# Patient Record
Sex: Female | Born: 1985 | Race: Black or African American | Hispanic: No | Marital: Married | Smoking: Former smoker
Health system: Southern US, Community
[De-identification: ages and names within clinical notes are randomized; demographics above are authoritative.]

## PROBLEM LIST (undated history)

## (undated) DIAGNOSIS — S40811A Abrasion of right upper arm, initial encounter: Secondary | ICD-10-CM

## (undated) DIAGNOSIS — M67442 Ganglion, left hand: Secondary | ICD-10-CM

## (undated) DIAGNOSIS — G5601 Carpal tunnel syndrome, right upper limb: Secondary | ICD-10-CM

## (undated) DIAGNOSIS — I10 Essential (primary) hypertension: Secondary | ICD-10-CM

## (undated) DIAGNOSIS — M7010 Bursitis, unspecified hand: Secondary | ICD-10-CM

## (undated) HISTORY — DX: Essential (primary) hypertension: I10

---

## 2006-08-17 ENCOUNTER — Emergency Department (HOSPITAL_COMMUNITY): Admission: EM | Admit: 2006-08-17 | Discharge: 2006-08-17 | Payer: Self-pay | Admitting: Emergency Medicine

## 2006-11-16 ENCOUNTER — Inpatient Hospital Stay (HOSPITAL_COMMUNITY): Admission: AD | Admit: 2006-11-16 | Discharge: 2006-11-16 | Payer: Self-pay | Admitting: Gynecology

## 2006-11-16 ENCOUNTER — Ambulatory Visit: Payer: Self-pay | Admitting: *Deleted

## 2006-12-11 ENCOUNTER — Ambulatory Visit (HOSPITAL_COMMUNITY): Admission: RE | Admit: 2006-12-11 | Discharge: 2006-12-11 | Payer: Self-pay | Admitting: Obstetrics and Gynecology

## 2007-01-15 ENCOUNTER — Ambulatory Visit (HOSPITAL_COMMUNITY): Admission: RE | Admit: 2007-01-15 | Discharge: 2007-01-15 | Payer: Self-pay | Admitting: Family Medicine

## 2007-03-01 ENCOUNTER — Inpatient Hospital Stay (HOSPITAL_COMMUNITY): Admission: AD | Admit: 2007-03-01 | Discharge: 2007-03-01 | Payer: Self-pay | Admitting: Obstetrics and Gynecology

## 2007-03-01 ENCOUNTER — Ambulatory Visit: Payer: Self-pay | Admitting: Obstetrics and Gynecology

## 2007-03-25 ENCOUNTER — Ambulatory Visit: Payer: Self-pay | Admitting: Physician Assistant

## 2007-03-25 ENCOUNTER — Inpatient Hospital Stay (HOSPITAL_COMMUNITY): Admission: AD | Admit: 2007-03-25 | Discharge: 2007-03-26 | Payer: Self-pay | Admitting: Obstetrics and Gynecology

## 2007-12-03 ENCOUNTER — Ambulatory Visit: Payer: Self-pay | Admitting: Hematology

## 2007-12-10 LAB — VITAMIN B12: Vitamin B-12: 559 pg/mL (ref 211–911)

## 2007-12-10 LAB — CBC WITH DIFFERENTIAL/PLATELET
BASO%: 0.7 % (ref 0.0–2.0)
Basophils Absolute: 0 10*3/uL (ref 0.0–0.1)
EOS%: 1.8 % (ref 0.0–7.0)
Eosinophils Absolute: 0.1 10*3/uL (ref 0.0–0.5)
HCT: 38.9 % (ref 34.8–46.6)
HGB: 13.5 g/dL (ref 11.6–15.9)
LYMPH%: 40.4 % (ref 14.0–48.0)
MCH: 32.8 pg (ref 26.0–34.0)
MCHC: 34.8 g/dL (ref 32.0–36.0)
MCV: 94.4 fL (ref 81.0–101.0)
MONO#: 0.4 10*3/uL (ref 0.1–0.9)
MONO%: 10 % (ref 0.0–13.0)
NEUT#: 2 10*3/uL (ref 1.5–6.5)
NEUT%: 47.1 % (ref 39.6–76.8)
Platelets: 283 10*3/uL (ref 145–400)
RBC: 4.12 10*6/uL (ref 3.70–5.32)
RDW: 12.6 % (ref 11.3–14.5)
WBC: 4.2 10*3/uL (ref 3.9–10.0)
lymph#: 1.7 10*3/uL (ref 0.9–3.3)

## 2007-12-10 LAB — TSH: TSH: 1.55 u[IU]/mL (ref 0.350–4.500)

## 2007-12-10 LAB — COMPREHENSIVE METABOLIC PANEL
ALT: 14 U/L (ref 0–35)
AST: 16 U/L (ref 0–37)
Albumin: 5 g/dL (ref 3.5–5.2)
Alkaline Phosphatase: 33 U/L — ABNORMAL LOW (ref 39–117)
BUN: 15 mg/dL (ref 6–23)
CO2: 21 mEq/L (ref 19–32)
Calcium: 10 mg/dL (ref 8.4–10.5)
Chloride: 107 mEq/L (ref 96–112)
Creatinine, Ser: 0.81 mg/dL (ref 0.40–1.20)
Glucose, Bld: 94 mg/dL (ref 70–99)
Potassium: 4.4 mEq/L (ref 3.5–5.3)
Sodium: 141 mEq/L (ref 135–145)
Total Bilirubin: 1.3 mg/dL — ABNORMAL HIGH (ref 0.3–1.2)
Total Protein: 7.7 g/dL (ref 6.0–8.3)

## 2007-12-10 LAB — MORPHOLOGY
PLT EST: ADEQUATE
RBC Comments: NORMAL

## 2007-12-10 LAB — LACTATE DEHYDROGENASE: LDH: 175 U/L (ref 94–250)

## 2007-12-10 LAB — FOLATE: Folate: >20 ng/mL

## 2007-12-10 LAB — CHCC SMEAR

## 2007-12-13 LAB — ANA: Anti Nuclear Antibody(ANA): NEGATIVE

## 2007-12-13 LAB — RHEUMATOID FACTOR: Rheumatoid fact SerPl-aCnc: <20 IU/mL (ref 0–20)

## 2008-06-01 ENCOUNTER — Ambulatory Visit: Payer: Self-pay | Admitting: Hematology

## 2008-06-21 ENCOUNTER — Emergency Department (HOSPITAL_COMMUNITY): Admission: EM | Admit: 2008-06-21 | Discharge: 2008-06-21 | Payer: Self-pay | Admitting: Emergency Medicine

## 2008-06-22 LAB — CBC WITH DIFFERENTIAL/PLATELET
BASO%: 0.9 % (ref 0.0–2.0)
Basophils Absolute: 0 10*3/uL (ref 0.0–0.1)
EOS%: 2.9 % (ref 0.0–7.0)
Eosinophils Absolute: 0.1 10*3/uL (ref 0.0–0.5)
HCT: 40.5 % (ref 34.8–46.6)
HGB: 13.9 g/dL (ref 11.6–15.9)
LYMPH%: 25.5 % (ref 14.0–49.7)
MCH: 32.5 pg (ref 25.1–34.0)
MCHC: 34.3 g/dL (ref 31.5–36.0)
MCV: 94.9 fL (ref 79.5–101.0)
MONO#: 0.6 10*3/uL (ref 0.1–0.9)
MONO%: 13.6 % (ref 0.0–14.0)
NEUT#: 2.5 10*3/uL (ref 1.5–6.5)
NEUT%: 57.1 % (ref 38.4–76.8)
Platelets: 271 10*3/uL (ref 145–400)
RBC: 4.26 10*6/uL (ref 3.70–5.45)
RDW: 13.6 % (ref 11.2–14.5)
WBC: 4.4 10*3/uL (ref 3.9–10.3)
lymph#: 1.1 10*3/uL (ref 0.9–3.3)

## 2008-06-22 LAB — COMPREHENSIVE METABOLIC PANEL
ALT: 13 U/L (ref 0–35)
AST: 14 U/L (ref 0–37)
Albumin: 4.7 g/dL (ref 3.5–5.2)
Alkaline Phosphatase: 34 U/L — ABNORMAL LOW (ref 39–117)
BUN: 13 mg/dL (ref 6–23)
CO2: 22 mEq/L (ref 19–32)
Calcium: 9.2 mg/dL (ref 8.4–10.5)
Chloride: 105 mEq/L (ref 96–112)
Creatinine, Ser: 0.68 mg/dL (ref 0.40–1.20)
Glucose, Bld: 91 mg/dL (ref 70–99)
Potassium: 4.3 mEq/L (ref 3.5–5.3)
Sodium: 138 mEq/L (ref 135–145)
Total Bilirubin: 0.9 mg/dL (ref 0.3–1.2)
Total Protein: 7.5 g/dL (ref 6.0–8.3)

## 2008-06-22 LAB — CHCC SMEAR

## 2008-12-06 ENCOUNTER — Emergency Department (HOSPITAL_COMMUNITY): Admission: EM | Admit: 2008-12-06 | Discharge: 2008-12-06 | Payer: Self-pay | Admitting: Family Medicine

## 2009-05-10 ENCOUNTER — Emergency Department (HOSPITAL_COMMUNITY): Admission: EM | Admit: 2009-05-10 | Discharge: 2009-05-10 | Payer: Self-pay | Admitting: Emergency Medicine

## 2009-11-19 ENCOUNTER — Emergency Department (HOSPITAL_COMMUNITY): Admission: EM | Admit: 2009-11-19 | Discharge: 2009-11-19 | Payer: Self-pay | Admitting: Family Medicine

## 2010-04-26 ENCOUNTER — Emergency Department (HOSPITAL_COMMUNITY)
Admission: EM | Admit: 2010-04-26 | Discharge: 2010-04-26 | Payer: Self-pay | Source: Home / Self Care | Admitting: Emergency Medicine

## 2010-04-29 LAB — POCT I-STAT, CHEM 8
BUN: 12 mg/dL (ref 6–23)
Calcium, Ion: 1.15 mmol/L (ref 1.12–1.32)
Chloride: 109 mEq/L (ref 96–112)
Creatinine, Ser: 0.7 mg/dL (ref 0.4–1.2)
Glucose, Bld: 94 mg/dL (ref 70–99)
HCT: 40 % (ref 36.0–46.0)
Hemoglobin: 13.6 g/dL (ref 12.0–15.0)
Potassium: 4.1 mEq/L (ref 3.5–5.1)
Sodium: 141 mEq/L (ref 135–145)
TCO2: 22 mmol/L (ref 0–100)

## 2010-09-04 ENCOUNTER — Inpatient Hospital Stay (INDEPENDENT_AMBULATORY_CARE_PROVIDER_SITE_OTHER)
Admission: RE | Admit: 2010-09-04 | Discharge: 2010-09-04 | Disposition: A | Discharge status: Home / Self Care | Payer: Managed Care, Other (non HMO) | Source: Ambulatory Visit | Attending: Emergency Medicine | Admitting: Emergency Medicine

## 2010-09-04 DIAGNOSIS — J069 Acute upper respiratory infection, unspecified: Secondary | ICD-10-CM

## 2011-01-20 LAB — CBC
HCT: 36.5
Hemoglobin: 12.6
MCHC: 34.6
MCV: 94.2
Platelets: 225
RBC: 3.88
RDW: 13.3
WBC: 6.9

## 2011-01-20 LAB — RPR: RPR Ser Ql: NONREACTIVE

## 2011-01-21 LAB — URINALYSIS, ROUTINE W REFLEX MICROSCOPIC
Bilirubin Urine: NEGATIVE
Glucose, UA: 100 — AB
Hgb urine dipstick: NEGATIVE
Ketones, ur: NEGATIVE
Nitrite: NEGATIVE
Protein, ur: NEGATIVE
Specific Gravity, Urine: 1.025
Urobilinogen, UA: 2 — ABNORMAL HIGH
pH: 6

## 2011-01-21 LAB — WET PREP, GENITAL
Clue Cells Wet Prep HPF POC: NONE SEEN
Trich, Wet Prep: NONE SEEN
Yeast Wet Prep HPF POC: NONE SEEN

## 2011-01-27 LAB — URINALYSIS, ROUTINE W REFLEX MICROSCOPIC
Bilirubin Urine: NEGATIVE
Glucose, UA: NEGATIVE
Hgb urine dipstick: NEGATIVE
Ketones, ur: NEGATIVE
Nitrite: NEGATIVE
Protein, ur: NEGATIVE
Specific Gravity, Urine: 1.02
Urobilinogen, UA: 1
pH: 7

## 2011-01-27 LAB — WET PREP, GENITAL: Yeast Wet Prep HPF POC: NONE SEEN

## 2011-01-27 LAB — GC/CHLAMYDIA PROBE AMP, GENITAL
Chlamydia, DNA Probe: NEGATIVE
GC Probe Amp, Genital: NEGATIVE

## 2011-02-23 ENCOUNTER — Emergency Department (HOSPITAL_COMMUNITY)
Admission: EM | Admit: 2011-02-23 | Discharge: 2011-02-23 | Disposition: A | Discharge status: Home / Self Care | Payer: Managed Care, Other (non HMO) | Attending: Emergency Medicine | Admitting: Emergency Medicine

## 2011-02-23 ENCOUNTER — Encounter: Payer: Self-pay | Admitting: Emergency Medicine

## 2011-02-23 DIAGNOSIS — X58XXXA Exposure to other specified factors, initial encounter: Secondary | ICD-10-CM | POA: Insufficient documentation

## 2011-02-23 DIAGNOSIS — IMO0002 Reserved for concepts with insufficient information to code with codable children: Secondary | ICD-10-CM | POA: Insufficient documentation

## 2011-02-23 DIAGNOSIS — R079 Chest pain, unspecified: Secondary | ICD-10-CM | POA: Insufficient documentation

## 2011-02-23 DIAGNOSIS — S29011A Strain of muscle and tendon of front wall of thorax, initial encounter: Secondary | ICD-10-CM

## 2011-02-23 MED ORDER — PREDNISONE 20 MG PO TABS
60.0000 mg | ORAL_TABLET | Freq: Once | ORAL | Status: AC
  Administered 2011-02-23: 60 mg via ORAL
  Filled 2011-02-23: qty 3

## 2011-02-23 MED ORDER — PREDNISONE 20 MG PO TABS: ORAL_TABLET | ORAL | Status: AC

## 2011-02-23 MED ORDER — HYDROCODONE-ACETAMINOPHEN 5-325 MG PO TABS: 1.0000 | ORAL_TABLET | Freq: Every day | ORAL | Status: AC

## 2011-02-23 NOTE — ED Notes (Signed)
Pt reports chest pain x 1 week. Pt denies any other symptoms.

## 2011-02-23 NOTE — ED Provider Notes (Signed)
History     CSN: [id number] Arrival date & time: 02/23/2011  7:09 PM   First MD Initiated Contact with Patient 02/23/11 2103      Chief Complaint  Patient presents with  . Chest Pain    (Consider location/radiation/quality/duration/timing/severity/associated sxs/prior treatment) HPI Comments: Patient has bilateral upper chest wall tenderness for the past week was treated in January of this year for the same with muscle relaxers and nonsteroidals and taken out of work for 3 months, she returned to the same job, doing the same task and again has pain with exertion  Patient is a 25 y.o. female presenting with chest pain. The history is provided by the patient.  Chest Pain The chest pain began 1 - 2 weeks ago. Chest pain occurs constantly. The chest pain is worsening. The pain is associated with breathing, lifting and exertion. At its most intense, the pain is at 6/10. The pain is currently at 4/10. The severity of the pain is moderate. The quality of the pain is described as aching. The pain does not radiate. Chest pain is worsened by exertion. Pertinent negatives for primary symptoms include no fever, no fatigue, no syncope, no shortness of breath, no cough, no nausea and no vomiting.  Pertinent negatives for associated symptoms include no diaphoresis, no numbness and no weakness. She tried aspirin for the symptoms.     History reviewed. No pertinent past medical history.  History reviewed. No pertinent past surgical history.  No family history on file.  History  Substance Use Topics  . Smoking status: Never Smoker   . Smokeless tobacco: Not on file  . Alcohol Use: Yes    OB History    Grav Para Term Preterm Abortions TAB SAB Ect Mult Living                10 Systems reviewed and are negative for acute change except as noted in the HPI.  Review of Systems  Constitutional: Negative for fever, diaphoresis and fatigue.  Respiratory: Negative for cough and shortness of breath.    Cardiovascular: Positive for chest pain. Negative for syncope.  Gastrointestinal: Negative for nausea and vomiting.  Neurological: Negative for weakness and numbness.    Allergies  Review of patient's allergies indicates no known allergies.  Home Medications   Current Outpatient Rx  Name Route Sig Dispense Refill  . THERA M PLUS PO TABS Oral Take 1 tablet by mouth daily.      Marland Kitchen MEDROXYPROGESTERONE ACETATE 150 MG/ML IM SUSP Intramuscular Inject 150 mg into the muscle every 3 (three) months. Birth control       BP 124/81  Pulse 84  Temp(Src) 98.3 F (36.8 C) (Oral)  Resp 15  SpO2 99%  Physical Exam  Constitutional: She is oriented to person, place, and time. She appears well-developed.  HENT:  Head: Normocephalic.  Eyes: EOM are normal.  Neck: Neck supple.  Cardiovascular: Normal rate.   Pulmonary/Chest: Breath sounds normal.  Abdominal: Bowel sounds are normal.  Musculoskeletal: Normal range of motion.       Right shoulder: She exhibits tenderness. She exhibits no swelling, no effusion and no crepitus.       Arms: Neurological: She is oriented to person, place, and time.  Skin: Skin is warm.  Psychiatric: She has a normal mood and affect.    ED Course  Procedures (including critical care time)  Labs Reviewed - No data to display No results found.   No diagnosis found.    MDM  Chest wall tender Costochondritis        Arman Filter, NP 02/23/11 2305

## 2011-02-23 NOTE — ED Provider Notes (Signed)
Medical screening examination/treatment/procedure(s) were performed by non-physician practitioner and as supervising physician I was immediately available for consultation/collaboration.   Geoffery Lyons, MD 02/23/11 [phone]

## 2011-03-20 ENCOUNTER — Encounter (HOSPITAL_COMMUNITY): Payer: Self-pay | Admitting: Emergency Medicine

## 2011-03-20 ENCOUNTER — Emergency Department (HOSPITAL_COMMUNITY)
Admission: EM | Admit: 2011-03-20 | Discharge: 2011-03-20 | Disposition: A | Discharge status: Home / Self Care | Payer: Managed Care, Other (non HMO) | Attending: Emergency Medicine | Admitting: Emergency Medicine

## 2011-03-20 ENCOUNTER — Emergency Department (HOSPITAL_COMMUNITY): Payer: Managed Care, Other (non HMO)

## 2011-03-20 ENCOUNTER — Other Ambulatory Visit: Payer: Self-pay

## 2011-03-20 ENCOUNTER — Emergency Department (INDEPENDENT_AMBULATORY_CARE_PROVIDER_SITE_OTHER)
Admission: EM | Admit: 2011-03-20 | Discharge: 2011-03-20 | Disposition: A | Discharge status: Home / Self Care | Payer: Managed Care, Other (non HMO) | Source: Home / Self Care | Attending: Emergency Medicine | Admitting: Emergency Medicine

## 2011-03-20 DIAGNOSIS — R0789 Other chest pain: Secondary | ICD-10-CM

## 2011-03-20 DIAGNOSIS — R0781 Pleurodynia: Secondary | ICD-10-CM

## 2011-03-20 DIAGNOSIS — R071 Chest pain on breathing: Secondary | ICD-10-CM | POA: Insufficient documentation

## 2011-03-20 LAB — BASIC METABOLIC PANEL
BUN: 11 mg/dL (ref 6–23)
Calcium: 9.1 mg/dL (ref 8.4–10.5)
Creatinine, Ser: 0.61 mg/dL (ref 0.50–1.10)
GFR calc Af Amer: >90 mL/min (ref >90)
GFR calc non Af Amer: >90 mL/min (ref >90)
Glucose, Bld: 92 mg/dL (ref 70–99)
Potassium: 3.4 mEq/L — ABNORMAL LOW (ref 3.5–5.1)

## 2011-03-20 LAB — CBC
HCT: 37.6 % (ref 36.0–46.0)
Hemoglobin: 12.7 g/dL (ref 12.0–15.0)
MCH: 31.6 pg (ref 26.0–34.0)
MCHC: 33.8 g/dL (ref 30.0–36.0)
MCV: 93.5 fL (ref 78.0–100.0)
Platelets: 271 K/uL (ref 150–400)
RBC: 4.02 MIL/uL (ref 3.87–5.11)
RDW: 12.8 % (ref 11.5–15.5)
WBC: 4.4 K/uL (ref 4.0–10.5)

## 2011-03-20 LAB — D-DIMER, QUANTITATIVE: D-Dimer, Quant: 0.53 ug{FEU}/mL — ABNORMAL HIGH (ref 0.00–0.48)

## 2011-03-20 MED ORDER — NAPROXEN 500 MG PO TABS: 500.0000 mg | ORAL_TABLET | Freq: Two times a day (BID) | ORAL | Status: DC

## 2011-03-20 MED ORDER — IBUPROFEN 800 MG PO TABS
ORAL_TABLET | ORAL | Status: AC
  Filled 2011-03-20: qty 1

## 2011-03-20 MED ORDER — HYDROCODONE-ACETAMINOPHEN 5-325 MG PO TABS
1.0000 | ORAL_TABLET | Freq: Once | ORAL | Status: AC
  Administered 2011-03-20: 1 via ORAL
  Filled 2011-03-20: qty 1

## 2011-03-20 MED ORDER — METAXALONE 800 MG PO TABS: 800.0000 mg | ORAL_TABLET | Freq: Three times a day (TID) | ORAL | Status: AC

## 2011-03-20 MED ORDER — IOHEXOL 350 MG/ML SOLN
100.0000 mL | Freq: Once | INTRAVENOUS | Status: AC | PRN
  Administered 2011-03-20: 100 mL via INTRAVENOUS

## 2011-03-20 MED ORDER — IBUPROFEN 800 MG PO TABS
800.0000 mg | ORAL_TABLET | Freq: Once | ORAL | Status: AC
  Administered 2011-03-20: 800 mg via ORAL

## 2011-03-20 MED ORDER — GI COCKTAIL ~~LOC~~
30.0000 mL | Freq: Once | ORAL | Status: AC
  Administered 2011-03-20: 30 mL via ORAL
  Filled 2011-03-20: qty 30

## 2011-03-20 MED ORDER — HYDROCODONE-ACETAMINOPHEN 5-325 MG PO TABS: 2.0000 | ORAL_TABLET | ORAL | Status: AC | PRN

## 2011-03-20 NOTE — ED Notes (Signed)
Pt st's she was at work and had sharp pain in left chest.   Pt st's pain increases with deep breath

## 2011-03-20 NOTE — ED Notes (Signed)
Old and new EKG given to EDP

## 2011-03-20 NOTE — ED Notes (Signed)
Pt states a sudden sharp pain in the left side of her chest and under her arm. Pt states that the pain is a achy pain right now. Pt denies SOB or N/V. Pt alert and oriented and able to follow commands and move extremities.

## 2011-03-20 NOTE — ED Provider Notes (Signed)
History     CSN: [id number] Arrival date & time: 03/20/2011  7:19 PM   First MD Initiated Contact with Patient 03/20/11 1834      Chief Complaint  Patient presents with  . Chest Pain    reports chest pain , sharp pain.  seen in ed for pain, transported by ems.  patient evaluated for pulmonary embolus--all tests negative.  continues to have left torso pain, under left breast and under left arm.  no improvement in pain.      HPI Comments: Pt with intermittent sharp nonradiating left sided CP lasting minutes and resolving. Worse with deep inspiration, lifting arms, torso rotation. States she does a lot of overhead lifting, pushing/pulling at work. Similar episodes since January. Has been treated successfully with NSAIDS, muscle relaxants, norco in past.  Seems to be worse at end of week after doing a lot of lifting/pushing/pulling. No cough, wheeze, SOB, N/V, fevers, abd pain, palpitations, diaphoresis, fevers. No recent/remote h/o trauma to chest. No rash. Seen in [facility] ED last night. Dimer slightly elevated but ED workup neg for coronary cause of CP. CT angio chest neg for PE.   Patient is a 25 y.o. female presenting with chest pain. The history is provided by the patient.  Chest Pain Chest pain occurs intermittently. The pain is associated with breathing. The quality of the pain is described as sharp and similar to previous episodes. The pain does not radiate. Chest pain is worsened by certain positions and deep breathing. Pertinent negatives for primary symptoms include no fever, no fatigue, no syncope, no shortness of breath, no cough, no wheezing, no palpitations, no abdominal pain, no nausea and no vomiting.  Pertinent negatives for associated symptoms include no diaphoresis and no near-syncope.  Pertinent negatives for past medical history include no CAD, no DVT and no PE.  Pertinent negatives for family medical history include: no early MI in family.     History reviewed. No pertinent past  medical history.  History reviewed. No pertinent past surgical history.  Family History  Problem Relation Age of Onset  . Lupus Mother   . Stroke Other   . Rheum arthritis Other     History  Substance Use Topics  . Smoking status: Never Smoker   . Smokeless tobacco: Not on file  . Alcohol Use: Yes    OB History    Grav Para Term Preterm Abortions TAB SAB Ect Mult Living                  Review of Systems  Constitutional: Negative for fever, diaphoresis and fatigue.  HENT: Negative.   Respiratory: Negative for cough, shortness of breath and wheezing.   Cardiovascular: Positive for chest pain. Negative for palpitations, leg swelling, syncope and near-syncope.  Gastrointestinal: Negative for nausea, vomiting and abdominal pain.  Genitourinary: Negative.   Musculoskeletal: Negative for back pain.  Skin: Negative for rash.  Neurological: Negative.     Allergies  Review of patient's allergies indicates no known allergies.  Home Medications   Current Outpatient Rx  Name Route Sig Dispense Refill  . HYDROCODONE-ACETAMINOPHEN 5-325 MG PO TABS Oral Take 2 tablets by mouth every 4 (four) hours as needed for pain. 20 tablet 0  . MEDROXYPROGESTERONE ACETATE 150 MG/ML IM SUSP Intramuscular Inject 150 mg into the muscle every 3 (three) months. Birth control     . METAXALONE 800 MG PO TABS Oral Take 1 tablet (800 mg total) by mouth 3 (three) times daily. 21 tablet 0  .  THERA M PLUS PO TABS Oral Take 1 tablet by mouth daily.      Marland Kitchen NAPROXEN 500 MG PO TABS Oral Take 1 tablet (500 mg total) by mouth 2 (two) times daily. 20 tablet 0    BP 127/86  Pulse 66  Temp(Src) 98.6 F (37 C) (Oral)  Resp 18  SpO2 100%  Physical Exam  Nursing note and vitals reviewed. Constitutional: She is oriented to person, place, and time. She appears well-developed and well-nourished. No distress.  HENT:  Head: Normocephalic and atraumatic.  Eyes: EOM are normal. Pupils are equal, round, and  reactive to light.  Neck: Normal range of motion.  Cardiovascular: Regular rhythm.   Pulmonary/Chest: Effort normal and breath sounds normal. She has no wheezes. She has no rales. She exhibits tenderness.       Pain worse with lateral bending to right. No rash  Abdominal: She exhibits no distension. There is Tenderness: left sided chest wall tenderness..  Musculoskeletal: Normal range of motion.  Neurological: She is alert and oriented to person, place, and time.  Skin: Skin is warm and dry. No rash noted.  Psychiatric: She has a normal mood and affect. Her behavior is normal. Judgment and thought content normal.    ED Course  Procedures (including critical care time)  Labs Reviewed - No data to display Dg Chest 2 View  03/20/2011  *RADIOLOGY REPORT*  Clinical Data: Left-sided chest pain.  CHEST - 2 VIEW  Comparison: Chest radiograph performed 04/26/2010  Findings: The lungs are well-aerated and clear.  There is no evidence of focal opacification, pleural effusion or pneumothorax.  The heart is normal in size; the mediastinal contour is within normal limits.  No acute osseous abnormalities are seen.  IMPRESSION: No acute cardiopulmonary process seen.  Original Report Authenticated By: Tonia Ghent, M.D.   Ct Angio Chest W/cm &/or Wo Cm  03/20/2011  *RADIOLOGY REPORT*  Clinical Data:  Sudden sharp left-sided chest pain, extending under left arm.  CT ANGIOGRAPHY CHEST WITH CONTRAST  Technique:  Multidetector CT imaging of the chest was performed using the standard protocol during bolus administration of intravenous contrast.  Multiplanar CT image reconstructions including MIPs were obtained to evaluate the vascular anatomy.  Contrast: OMNIPAQUE IOHEXOL 350 MG/ML IV SOLN  Comparison:  Chest radiograph performed earlier today at 02:59 a.m.  Findings:  There is no evidence of pulmonary embolus.  The lungs are clear bilaterally.  There is no evidence of significant focal consolidation, pleural  effusion or pneumothorax. No masses are identified; no abnormal focal contrast enhancement is seen.  The mediastinum is unremarkable in appearance.  No mediastinal lymphadenopathy is seen.  No pericardial effusion is identified. The great vessels are unremarkable in appearance.  Residual thymic tissue is within normal limits.  No axillary lymphadenopathy is seen.  The thyroid gland is unremarkable in appearance.  The visualized portions of the liver and spleen are unremarkable. The visualized portions of the pancreas, gallbladder, adrenal glands and kidneys are within normal limits.  Note is made of reflux of contrast into the IVC and hepatic veins.  A tiny hiatal hernia is seen.  No acute osseous abnormalities are seen.  Review of the MIP images confirms the above findings.  IMPRESSION:  1.  No evidence of pulmonary embolus. 2.  Lungs clear bilaterally. 3.  Tiny hiatal hernia noted.  Original Report Authenticated By: Tonia Ghent, M.D.     1. Musculoskeletal chest pain     Date: 03/20/2011  Rate: 57  Rhythm: normal sinus rhythm  QRS Axis: right  Intervals: normal  ST/T Wave abnormalities: normal  Conduction Disutrbances:none  Narrative Interpretation:   Old EKG Reviewed: unchanged    MDM  Previous chart, labs, imaging reviewed.  Pt with extensive negative ED workup last night. Pain unchanged here. No EKG changes. Was not sent home with rx from ED. Is currently seeing Occupational health for this as it is a WC comp. Will tx with high dose nsaids, muscle relaxants and have f/u with them.   Luiz Blare, MD 03/20/11 2257

## 2011-03-20 NOTE — ED Notes (Signed)
See above

## 2011-03-20 NOTE — ED Notes (Signed)
Pt placed backed on cardiac monitor, bp cuff, and pulse ox after CT scan

## 2011-03-20 NOTE — ED Provider Notes (Signed)
History     CSN: [id number] Arrival date & time: 03/20/2011 12:58 AM   First MD Initiated Contact with Patient 03/20/11 0205      Chief Complaint  Patient presents with  . Chest Pain   HPI  History provided by the patient. Pt is a 25 year old female who complains of left sharp chest pain that came on acutely around 11:30 PM (3 hours ago) while walking at work.  Pt reports pain is worse with deep breathing.  Pt denies any heart palpitations, SOB, hemoptysis, N/V, sweats.  Pain does not radiate. Pain is improved at rest.  Pt uses Depo-provera for birth control.  Pt denise any recent travel, surgery, hx of cancer, previous DVT or PE.  Pt has no other significant PMH.   History reviewed. No pertinent past medical history.  History reviewed. No pertinent past surgical history.  History reviewed. No pertinent family history.  History  Substance Use Topics  . Smoking status: Never Smoker   . Smokeless tobacco: Not on file  . Alcohol Use: Yes    OB History    Grav Para Term Preterm Abortions TAB SAB Ect Mult Living                  Review of Systems  Constitutional: Negative for fever and chills.  Respiratory: Negative for cough and shortness of breath.   Cardiovascular: Positive for chest pain. Negative for palpitations.  Gastrointestinal: Negative for nausea, abdominal pain, diarrhea and constipation.  Genitourinary: Negative for dysuria, hematuria and flank pain.  Neurological: Negative for dizziness, syncope and light-headedness.  All other systems reviewed and are negative.    Allergies  Review of patient's allergies indicates no known allergies.  Home Medications   Current Outpatient Rx  Name Route Sig Dispense Refill  . ASPIRIN 81 MG PO CHEW Oral Chew 81 mg by mouth once.      Marland Kitchen MEDROXYPROGESTERONE ACETATE 150 MG/ML IM SUSP Intramuscular Inject 150 mg into the muscle every 3 (three) months. Birth control     . THERA M PLUS PO TABS Oral Take 1 tablet by mouth  daily.        BP 114/76  Pulse 66  Temp(Src) 98.6 F (37 C) (Oral)  Resp 16  SpO2 99%  Physical Exam  Nursing note and vitals reviewed. Constitutional: She is oriented to person, place, and time. She appears well-developed and well-nourished. No distress.  HENT:  Head: Normocephalic.  Eyes: EOM are normal. Pupils are equal, round, and reactive to light.  Neck: Normal range of motion. Neck supple.  Cardiovascular: Normal rate, regular rhythm and normal heart sounds.   Pulmonary/Chest: Effort normal and breath sounds normal. She has no wheezes. She has no rales.       Reproducible left lateral chest wall tenderness underneath the axilla. No deformity or crepitus  Abdominal: Soft. Bowel sounds are normal. She exhibits no distension. There is no tenderness. There is no rebound.  Neurological: She is alert and oriented to person, place, and time.  Skin: Skin is warm.  Psychiatric: She has a normal mood and affect. Her behavior is normal.    ED Course  Procedures (including critical care time)  Labs Reviewed  D-DIMER, QUANTITATIVE - Abnormal; Notable for the following:    D-Dimer, Quant 0.53 (*)    All other components within normal limits  CBC  POCT I-STAT TROPONIN I  BASIC METABOLIC PANEL  POCT CARDIAC MARKERS  I-STAT TROPONIN I   Results for orders placed during the  hospital encounter of 03/20/11  CBC      Component Value Range   WBC 4.4  4.0 - 10.5 (K/uL)   RBC 4.02  3.87 - 5.11 (MIL/uL)   Hemoglobin 12.7  12.0 - 15.0 (g/dL)   HCT 19.1  47.8 - 29.5 (%)   MCV 93.5  78.0 - 100.0 (fL)   MCH 31.6  26.0 - 34.0 (pg)   MCHC 33.8  30.0 - 36.0 (g/dL)   RDW 62.1  30.8 - 65.7 (%)   Platelets 271  150 - 400 (K/uL)  BASIC METABOLIC PANEL      Component Value Range   Sodium 139  135 - 145 (mEq/L)   Potassium 3.4 (*) 3.5 - 5.1 (mEq/L)   Chloride 106  96 - 112 (mEq/L)   CO2 22  19 - 32 (mEq/L)   Glucose, Bld 92  70 - 99 (mg/dL)   BUN 11  6 - 23 (mg/dL)   Creatinine, Ser 8.46   0.50 - 1.10 (mg/dL)   Calcium 9.1  8.4 - 96.2 (mg/dL)   GFR calc non Af Amer >90  >90 (mL/min)   GFR calc Af Amer >90  >90 (mL/min)  POCT I-STAT TROPONIN I      Component Value Range   Troponin i, poc 0.00  0.00 - 0.08 (ng/mL)   Comment 3           D-DIMER, QUANTITATIVE      Component Value Range   D-Dimer, Quant 0.53 (*) 0.00 - 0.48 (ug/mL-FEU)      Dg Chest 2 View  03/20/2011  *RADIOLOGY REPORT*  Clinical Data: Left-sided chest pain.  CHEST - 2 VIEW  Comparison: Chest radiograph performed 04/26/2010  Findings: The lungs are well-aerated and clear.  There is no evidence of focal opacification, pleural effusion or pneumothorax.  The heart is normal in size; the mediastinal contour is within normal limits.  No acute osseous abnormalities are seen.  IMPRESSION: No acute cardiopulmonary process seen.  Original Report Authenticated By: Tonia Ghent, M.D.   Ct Angio Chest W/cm &/or Wo Cm  03/20/2011  *RADIOLOGY REPORT*  Clinical Data:  Sudden sharp left-sided chest pain, extending under left arm.  CT ANGIOGRAPHY CHEST WITH CONTRAST  Technique:  Multidetector CT imaging of the chest was performed using the standard protocol during bolus administration of intravenous contrast.  Multiplanar CT image reconstructions including MIPs were obtained to evaluate the vascular anatomy.  Contrast: OMNIPAQUE IOHEXOL 350 MG/ML IV SOLN  Comparison:  Chest radiograph performed earlier today at 02:59 a.m.  Findings:  There is no evidence of pulmonary embolus.  The lungs are clear bilaterally.  There is no evidence of significant focal consolidation, pleural effusion or pneumothorax. No masses are identified; no abnormal focal contrast enhancement is seen.  The mediastinum is unremarkable in appearance.  No mediastinal lymphadenopathy is seen.  No pericardial effusion is identified. The great vessels are unremarkable in appearance.  Residual thymic tissue is within normal limits.  No axillary lymphadenopathy is  seen.  The thyroid gland is unremarkable in appearance.  The visualized portions of the liver and spleen are unremarkable. The visualized portions of the pancreas, gallbladder, adrenal glands and kidneys are within normal limits.  Note is made of reflux of contrast into the IVC and hepatic veins.  A tiny hiatal hernia is seen.  No acute osseous abnormalities are seen.  Review of the MIP images confirms the above findings.  IMPRESSION:  1.  No evidence of pulmonary embolus. 2.  Lungs clear bilaterally. 3.  Tiny hiatal hernia noted.  Original Report Authenticated By: Tonia Ghent, M.D.     1. Pleuritic chest pain   2. Musculoskeletal chest pain       MDM  2:00A.m. patient seen and evaluated. Patient in no acute distress. Patient with normal respirations and good O2 sats. Patient with no tachycardia.     Date: 03/20/2011  Rate: 70  Rhythm: normal sinus rhythm  QRS Axis: right  Intervals: normal  ST/T Wave abnormalities: normal  Conduction Disutrbances:none  Narrative Interpretation:   Old EKG Reviewed: unchanged from 04/26/1010      Angus Seller, PA 03/20/11 [phone]

## 2011-03-21 NOTE — ED Provider Notes (Signed)
Medical screening examination/treatment/procedure(s) were performed by non-physician practitioner and as supervising physician I was immediately available for consultation/collaboration.  Geoffery Lyons, MD 03/21/11 [phone]

## 2011-12-14 DIAGNOSIS — G5601 Carpal tunnel syndrome, right upper limb: Secondary | ICD-10-CM

## 2011-12-14 HISTORY — DX: Carpal tunnel syndrome, right upper limb: G56.01

## 2011-12-17 ENCOUNTER — Encounter (HOSPITAL_COMMUNITY): Payer: Self-pay | Admitting: Emergency Medicine

## 2011-12-17 ENCOUNTER — Emergency Department (INDEPENDENT_AMBULATORY_CARE_PROVIDER_SITE_OTHER)
Admission: EM | Admit: 2011-12-17 | Discharge: 2011-12-17 | Disposition: A | Discharge status: Home / Self Care | Payer: Managed Care, Other (non HMO) | Source: Home / Self Care | Attending: Family Medicine | Admitting: Family Medicine

## 2011-12-17 DIAGNOSIS — G5600 Carpal tunnel syndrome, unspecified upper limb: Secondary | ICD-10-CM

## 2011-12-17 DIAGNOSIS — G5603 Carpal tunnel syndrome, bilateral upper limbs: Secondary | ICD-10-CM

## 2011-12-17 MED ORDER — DICLOFENAC SODIUM 1 % TD GEL: 4.0000 g | Freq: Four times a day (QID) | TRANSDERMAL | Status: DC

## 2011-12-17 NOTE — ED Notes (Signed)
Pt here because she has exacerbated her carpal tunnel at work last night. She said it is always harder on the 1st and even worse because there was a holiday. Pt states her hands were thick and numb feeling this morning after working, and now she can't really grasp or clutch things. Pt has carpal tunnel surgery scheduled for Monday, but states her surgeon could not give her a prescription for pain meds.

## 2011-12-17 NOTE — ED Provider Notes (Signed)
History     CSN: [id number]  Arrival date & time 12/17/11  7829   First MD Initiated Contact with Patient 12/17/11 1813      Chief Complaint  Patient presents with  . Carpal Tunnel    (Consider location/radiation/quality/duration/timing/severity/associated sxs/prior treatment) Patient is a 26 y.o. female presenting with wrist pain. The history is provided by the patient.  Wrist Pain This is a chronic problem. The current episode started more than 1 week ago. The problem has been gradually worsening. Associated symptoms comments: Numbness and tingling , pos ncs done by dr Merlyn Lot today. Is scheduled for surgery on mon.Marland Kitchen    History reviewed. No pertinent past medical history.  History reviewed. No pertinent past surgical history.  Family History  Problem Relation Age of Onset  . Lupus Mother   . Stroke Other   . Rheum arthritis Other     History  Substance Use Topics  . Smoking status: Never Smoker   . Smokeless tobacco: Not on file  . Alcohol Use: Yes    OB History    Grav Para Term Preterm Abortions TAB SAB Ect Mult Living                  Review of Systems  Constitutional: Negative.   Musculoskeletal: Positive for joint swelling.    Allergies  Review of patient's allergies indicates no known allergies.  Home Medications   Current Outpatient Rx  Name Route Sig Dispense Refill  . DICLOFENAC SODIUM 1 % TD GEL Topical Apply 4 g topically 4 (four) times daily. Please instruct in dosing. 100 g 1  . MEDROXYPROGESTERONE ACETATE 150 MG/ML IM SUSP Intramuscular Inject 150 mg into the muscle every 3 (three) months. Birth control     . THERA M PLUS PO TABS Oral Take 1 tablet by mouth daily.      Marland Kitchen NAPROXEN 500 MG PO TABS Oral Take 1 tablet (500 mg total) by mouth 2 (two) times daily. 20 tablet 0    BP 130/84  Pulse 90  Temp 98.4 F (36.9 C) (Oral)  Resp 16  SpO2 98%  Physical Exam  Nursing note and vitals reviewed. Constitutional: She is oriented to person,  place, and time. She appears well-developed and well-nourished.  Musculoskeletal: She exhibits tenderness.       Right wrist: She exhibits decreased range of motion, tenderness and swelling.       Arms: Neurological: She is alert and oriented to person, place, and time.  Skin: Skin is warm and dry.    ED Course  Procedures (including critical care time)  Labs Reviewed - No data to display No results found.   1. Carpal tunnel syndrome, bilateral       MDM          Linna Hoff, MD 12/17/11 [phone]

## 2011-12-19 ENCOUNTER — Encounter (HOSPITAL_BASED_OUTPATIENT_CLINIC_OR_DEPARTMENT_OTHER): Payer: Self-pay | Admitting: *Deleted

## 2011-12-19 ENCOUNTER — Other Ambulatory Visit: Payer: Self-pay | Admitting: Orthopedic Surgery

## 2011-12-19 DIAGNOSIS — S40811A Abrasion of right upper arm, initial encounter: Secondary | ICD-10-CM

## 2011-12-19 HISTORY — DX: Abrasion of right upper arm, initial encounter: S40.811A

## 2011-12-22 ENCOUNTER — Ambulatory Visit (HOSPITAL_BASED_OUTPATIENT_CLINIC_OR_DEPARTMENT_OTHER)
Admission: RE | Admit: 2011-12-22 | Discharge: 2011-12-22 | Disposition: A | Discharge status: Home / Self Care | Payer: Managed Care, Other (non HMO) | Source: Ambulatory Visit | Attending: Orthopedic Surgery | Admitting: Orthopedic Surgery

## 2011-12-22 ENCOUNTER — Encounter (HOSPITAL_BASED_OUTPATIENT_CLINIC_OR_DEPARTMENT_OTHER)
Admission: RE | Disposition: A | Discharge status: Home / Self Care | Payer: Self-pay | Source: Ambulatory Visit | Attending: Orthopedic Surgery

## 2011-12-22 ENCOUNTER — Ambulatory Visit (HOSPITAL_BASED_OUTPATIENT_CLINIC_OR_DEPARTMENT_OTHER): Payer: Managed Care, Other (non HMO) | Admitting: *Deleted

## 2011-12-22 ENCOUNTER — Encounter (HOSPITAL_BASED_OUTPATIENT_CLINIC_OR_DEPARTMENT_OTHER): Payer: Self-pay | Admitting: *Deleted

## 2011-12-22 DIAGNOSIS — G5600 Carpal tunnel syndrome, unspecified upper limb: Secondary | ICD-10-CM | POA: Insufficient documentation

## 2011-12-22 HISTORY — DX: Bursitis, unspecified hand: M70.10

## 2011-12-22 HISTORY — DX: Carpal tunnel syndrome, right upper limb: G56.01

## 2011-12-22 HISTORY — DX: Abrasion of right upper arm, initial encounter: S40.811A

## 2011-12-22 HISTORY — PX: CARPAL TUNNEL RELEASE: SHX101

## 2011-12-22 SURGERY — CARPAL TUNNEL RELEASE
Anesthesia: General | Site: Wrist | Laterality: Right | Wound class: Clean

## 2011-12-22 MED ORDER — BUPIVACAINE HCL (PF) 0.25 % IJ SOLN
INTRAMUSCULAR | Status: DC | PRN
  Administered 2011-12-22: 5 mL

## 2011-12-22 MED ORDER — OXYCODONE HCL 5 MG/5ML PO SOLN: 5.0000 mg | Freq: Once | ORAL | Status: DC | PRN

## 2011-12-22 MED ORDER — ONDANSETRON HCL 4 MG/2ML IJ SOLN
INTRAMUSCULAR | Status: DC | PRN
  Administered 2011-12-22: 4 mg via INTRAVENOUS

## 2011-12-22 MED ORDER — DEXAMETHASONE SODIUM PHOSPHATE 10 MG/ML IJ SOLN
INTRAMUSCULAR | Status: DC | PRN
  Administered 2011-12-22: 10 mg via INTRAVENOUS

## 2011-12-22 MED ORDER — LIDOCAINE HCL (CARDIAC) 20 MG/ML IV SOLN
INTRAVENOUS | Status: DC | PRN
  Administered 2011-12-22: 80 mg via INTRAVENOUS

## 2011-12-22 MED ORDER — METOCLOPRAMIDE HCL 5 MG/ML IJ SOLN
INTRAMUSCULAR | Status: DC | PRN
  Administered 2011-12-22: 10 mg via INTRAVENOUS

## 2011-12-22 MED ORDER — FENTANYL CITRATE 0.05 MG/ML IJ SOLN
25.0000 ug | INTRAMUSCULAR | Status: DC | PRN
  Administered 2011-12-22: 50 ug via INTRAVENOUS
  Administered 2011-12-22 (×2): 25 ug via INTRAVENOUS

## 2011-12-22 MED ORDER — OXYCODONE HCL 5 MG PO TABS: 5.0000 mg | ORAL_TABLET | Freq: Once | ORAL | Status: DC | PRN

## 2011-12-22 MED ORDER — LACTATED RINGERS IV SOLN
INTRAVENOUS | Status: DC
  Administered 2011-12-22: 08:00:00 via INTRAVENOUS

## 2011-12-22 MED ORDER — PROPOFOL 10 MG/ML IV BOLUS
INTRAVENOUS | Status: DC | PRN
  Administered 2011-12-22: 250 mg via INTRAVENOUS

## 2011-12-22 MED ORDER — CEFAZOLIN SODIUM-DEXTROSE 2-3 GM-% IV SOLR
INTRAVENOUS | Status: DC | PRN
  Administered 2011-12-22: 2 g via INTRAVENOUS

## 2011-12-22 MED ORDER — FENTANYL CITRATE 0.05 MG/ML IJ SOLN
INTRAMUSCULAR | Status: DC | PRN
  Administered 2011-12-22: 50 ug via INTRAVENOUS
  Administered 2011-12-22 (×2): 25 ug via INTRAVENOUS

## 2011-12-22 MED ORDER — CEFAZOLIN SODIUM 1-5 GM-% IV SOLN: 1.0000 g | Freq: Once | INTRAVENOUS | Status: DC

## 2011-12-22 MED ORDER — METOCLOPRAMIDE HCL 5 MG/ML IJ SOLN: 10.0000 mg | Freq: Once | INTRAMUSCULAR | Status: DC | PRN

## 2011-12-22 MED ORDER — HYDROCODONE-ACETAMINOPHEN 5-325 MG PO TABS: ORAL_TABLET | ORAL | Status: DC

## 2011-12-22 SURGICAL SUPPLY — 37 items
BANDAGE ELASTIC 3 VELCRO ST LF (GAUZE/BANDAGES/DRESSINGS) ×2 IMPLANT
BANDAGE GAUZE ELAST BULKY 4 IN (GAUZE/BANDAGES/DRESSINGS) ×2 IMPLANT
BLADE MINI RND TIP GREEN BEAV (BLADE) IMPLANT
BLADE SURG 15 STRL LF DISP TIS (BLADE) ×2 IMPLANT
BLADE SURG 15 STRL SS (BLADE) ×2
BNDG ESMARK 4X9 LF (GAUZE/BANDAGES/DRESSINGS) ×2 IMPLANT
CHLORAPREP W/TINT 26ML (MISCELLANEOUS) ×2 IMPLANT
CLOTH BEACON ORANGE TIMEOUT ST (SAFETY) ×2 IMPLANT
CORDS BIPOLAR (ELECTRODE) ×2 IMPLANT
COVER MAYO STAND STRL (DRAPES) ×2 IMPLANT
COVER TABLE BACK 60X90 (DRAPES) ×2 IMPLANT
CUFF TOURNIQUET SINGLE 18IN (TOURNIQUET CUFF) ×2 IMPLANT
DRAPE EXTREMITY T 121X128X90 (DRAPE) ×2 IMPLANT
DRAPE SURG 17X23 STRL (DRAPES) ×2 IMPLANT
DRSG PAD ABDOMINAL 8X10 ST (GAUZE/BANDAGES/DRESSINGS) ×2 IMPLANT
GAUZE XEROFORM 1X8 LF (GAUZE/BANDAGES/DRESSINGS) ×2 IMPLANT
GLOVE BIO SURGEON STRL SZ 6.5 (GLOVE) ×2 IMPLANT
GLOVE BIO SURGEON STRL SZ7.5 (GLOVE) ×2 IMPLANT
GLOVE EXAM NITRILE PF MED BLUE (GLOVE) ×2 IMPLANT
GOWN PREVENTION PLUS XLARGE (GOWN DISPOSABLE) ×2 IMPLANT
GOWN STRL REIN XL XLG (GOWN DISPOSABLE) ×2 IMPLANT
NDL SAFETY ECLIPSE 18X1.5 (NEEDLE) ×1 IMPLANT
NEEDLE 27GAX1X1/2 (NEEDLE) ×2 IMPLANT
NEEDLE HYPO 18GX1.5 SHARP (NEEDLE) ×1
NEEDLE HYPO 25X1 1.5 SAFETY (NEEDLE) IMPLANT
NS IRRIG 1000ML POUR BTL (IV SOLUTION) ×2 IMPLANT
PACK BASIN DAY SURGERY FS (CUSTOM PROCEDURE TRAY) ×2 IMPLANT
PADDING CAST ABS 4INX4YD NS (CAST SUPPLIES)
PADDING CAST ABS COTTON 4X4 ST (CAST SUPPLIES) IMPLANT
SPONGE GAUZE 4X4 12PLY (GAUZE/BANDAGES/DRESSINGS) ×2 IMPLANT
STOCKINETTE 4X48 STRL (DRAPES) ×2 IMPLANT
SUT ETHILON 4 0 PS 2 18 (SUTURE) ×2 IMPLANT
SYR BULB 3OZ (MISCELLANEOUS) ×2 IMPLANT
SYR CONTROL 10ML LL (SYRINGE) ×2 IMPLANT
TOWEL OR 17X24 6PK STRL BLUE (TOWEL DISPOSABLE) ×2 IMPLANT
UNDERPAD 30X30 INCONTINENT (UNDERPADS AND DIAPERS) ×2 IMPLANT
WATER STERILE IRR 1000ML POUR (IV SOLUTION) IMPLANT

## 2011-12-22 NOTE — Op Note (Signed)
Dictation [phone]

## 2011-12-22 NOTE — H&P (Signed)
  Brandy Tyler is an 26 y.o. female.   Chief Complaint: carpal tunnel HPI: 26 yo rhd female with 8 months of pain at the right wrist and pins and needles sensation in the digits.  Previous carpal tunnel syndrome when pregnant that resolved.  Positive nocturnal symptoms.  Positive nerve conduction studies.  Past Medical History  Diagnosis Date  . Bursitis of hand     right hand - also tendonitis  . 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 Abrasion of arm, right 12/19/2011    Past Surgical History  Procedure Date  . No past surgeries     Family History  Problem Relation Age of Onset  . Lupus Mother   . Stroke Other   . Rheum arthritis Other    Social History:  reports that she has never smoked. She does not have any smokeless tobacco history on file. She reports that she drinks alcohol. She reports that she does not use illicit drugs.  Allergies: No Known Allergies  Medications Prior to Admission  Medication Sig Dispense Refill  . medroxyPROGESTERone (DEPO-PROVERA) 150 MG/ML injection Inject 150 mg into the muscle every 3 (three) months. Birth control      . Multiple Vitamins-Minerals (MULTIVITAMINS THER. W/MINERALS) TABS Take 1 tablet by mouth daily.          Results for orders placed during the hospital encounter of 12/22/11 (from the past 48 hour(s))  POCT HEMOGLOBIN-HEMACUE     Status: Normal   Collection Time   12/22/11  8:01 AM      Component Value Range Comment   Hemoglobin 12.3  12.0 - 15.0 g/dL     No results found.   A comprehensive review of systems was negative except for: Eyes: positive for contacts/glasses  Blood pressure 118/82, pulse 72, temperature 99 F (37.2 C), temperature source Oral, resp. rate 20, height 4\' 10"  (1.473 m), weight 54.432 kg (120 lb), SpO2 100.00%.  General appearance: alert, cooperative and appears stated age Head: Normocephalic, without obvious abnormality, atraumatic Neck: supple, symmetrical, trachea midline Resp: clear  to auscultation bilaterally Cardio: regular rate and rhythm GI: soft, non-tender; bowel sounds normal; no masses,  no organomegaly Extremities: light touch sensation and capillary refill present all digits.  +epl/fpl/io.  positive tinels, phalens, durkins at median nerve at carpal tunnel on right side. Pulses: 2+ and symmetric Skin: Skin color, texture, turgor normal. No rashes or lesions Neurologic: Grossly normal Incision/Wound: na  Assessment/Plan Right carpal tunnel syndrome.  Discussed non operative and operative treatment options.  She wishes to proceed with surgical release.  Risks, benefits, and alternatives of surgery were discussed and the patient agrees with the plan of care.   Brandy Tyler 12/22/2011, 8:46 AM

## 2011-12-22 NOTE — Anesthesia Preprocedure Evaluation (Addendum)
Anesthesia Evaluation  Patient identified by MRN, date of birth, ID band Patient awake    Reviewed: Allergy & Precautions, H&P , NPO status , Patient's Chart, lab work & pertinent test results, reviewed documented beta blocker date and time   Airway Mallampati: II TM Distance: >3 FB Neck ROM: full    Dental   Pulmonary neg pulmonary ROS,  breath sounds clear to auscultation        Cardiovascular negative cardio ROS  Rhythm:regular     Neuro/Psych negative neurological ROS  negative psych ROS   GI/Hepatic negative GI ROS, Neg liver ROS,   Endo/Other  negative endocrine ROS  Renal/GU negative Renal ROS  negative genitourinary   Musculoskeletal   Abdominal   Peds  Hematology negative hematology ROS (+)   Anesthesia Other Findings See surgeon's H&P   Reproductive/Obstetrics negative OB ROS                           Anesthesia Physical Anesthesia Plan  ASA: I  Anesthesia Plan: General   Post-op Pain Management:    Induction: Intravenous  Airway Management Planned: LMA  Additional Equipment:   Intra-op Plan:   Post-operative Plan: Extubation in OR  Informed Consent: I have reviewed the patients History and Physical, chart, labs and discussed the procedure including the risks, benefits and alternatives for the proposed anesthesia with the patient or authorized representative who has indicated his/her understanding and acceptance.   Dental Advisory Given  Plan Discussed with: CRNA and Surgeon  Anesthesia Plan Comments:         Anesthesia Quick Evaluation  

## 2011-12-22 NOTE — Anesthesia Procedure Notes (Signed)
Procedure Name: LMA Insertion Date/Time: 12/22/2011 8:57 AM Performed by: Meyer Russel Pre-anesthesia Checklist: Patient identified, Emergency Drugs available, Suction available and Patient being monitored Patient Re-evaluated:Patient Re-evaluated prior to inductionOxygen Delivery Method: Circle System Utilized Preoxygenation: Pre-oxygenation with 100% oxygen Intubation Type: IV induction Ventilation: Mask ventilation without difficulty LMA: LMA inserted LMA Size: 3.0 Number of attempts: 1 Airway Equipment and Method: bite block Placement Confirmation: positive ETCO2 and breath sounds checked- equal and bilateral Tube secured with: Tape Dental Injury: Teeth and Oropharynx as per pre-operative assessment

## 2011-12-22 NOTE — Transfer of Care (Signed)
Immediate Anesthesia Transfer of Care Note  Patient: Brandy Tyler  Procedure(s) Performed: Procedure(s) (LRB) with comments: CARPAL TUNNEL RELEASE (Right)  Patient Location: PACU  Anesthesia Type: General  Level of Consciousness: sedated  Airway & Oxygen Therapy: Patient Spontanous Breathing and Patient connected to face mask oxygen  Post-op Assessment: Report given to PACU RN and Post -op Vital signs reviewed and stable  Post vital signs: Reviewed and stable  Complications: No apparent anesthesia complications

## 2011-12-22 NOTE — Anesthesia Postprocedure Evaluation (Signed)
Anesthesia Post Note  Patient: Brandy Tyler  Procedure(s) Performed: Procedure(s) (LRB): CARPAL TUNNEL RELEASE (Right)  Anesthesia type: General  Patient location: PACU  Post pain: Pain level controlled  Post assessment: Patient's Cardiovascular Status Stable  Last Vitals:  Filed Vitals:   12/22/11 1015  BP: 107/75  Pulse: 63  Temp:   Resp: 20    Post vital signs: Reviewed and stable  Level of consciousness: alert  Complications: No apparent anesthesia complications

## 2011-12-23 ENCOUNTER — Encounter (HOSPITAL_BASED_OUTPATIENT_CLINIC_OR_DEPARTMENT_OTHER): Payer: Self-pay | Admitting: Orthopedic Surgery

## 2011-12-23 NOTE — Op Note (Signed)
[name], Tyler NO.:  [id number]  MEDICAL RECORD NO.:  [id number]  LOCATION:                                 FACILITY:  PHYSICIAN:  Betha Loa, MD        DATE OF BIRTH:  [birth year]  DATE OF PROCEDURE:  12/22/2011 DATE OF DISCHARGE:                              OPERATIVE REPORT   PREOPERATIVE DIAGNOSIS:  Right carpal tunnel syndrome.  POSTOPERATIVE DIAGNOSIS:  Right carpal tunnel syndrome.  PROCEDURE:  Right carpal tunnel release.  SURGEON:  Betha Loa, MD  ASSISTANT:  None.  ANESTHESIA:  General.  IV FLUIDS:  Per anesthesia flow sheet.  ESTIMATED BLOOD LOSS:  Minimal.  COMPLICATIONS:  None.  SPECIMENS:  None.  TOURNIQUET TIME:  15 minutes.  DISPOSITION:  Stable to PACU.  INDICATIONS:  Brandy Tyler is a 26 year old right-hand-dominant female who has had pain in her right wrist and pins and needle sensation in the fingers for approximately 7-8 months.  She was seen in the office where she was found to have positive Tinel's and Phalen's jerk into the median nerve at the carpal tunnel.  Nerve conduction studies were positive. She has nocturnal symptoms.  She wished to have right carpal tunnel release to relieve the symptoms.  Risks, benefits, and alternatives of surgery were discussed including the risk of blood loss, infection, damage to nerves, vessels, tendons, ligaments, bone, failure of surgery, need for additional surgery, complications with wound healing, continued pain, continued carpal tunnel syndrome.  She voiced understanding of these risks and elected to proceed.  OPERATIVE COURSE:  After being identified preoperatively by myself, the patient and I agreed upon procedure and site of procedure.  The surgical site was marked.  The risks, benefits, and alternatives of surgery were reviewed and she wished to proceed.  Surgical consent had been signed. She was given 1 g of IV Ancef as preoperative antibiotic prophylaxis. She was  transported to the operating room, placed on the operating table in a supine position with the right upper extremity on arm board. General anesthesia was induced by anesthesiologist.  The right upper extremity was prepped and draped in normal sterile orthopedic fashion. Surgical pause was performed between surgeons, anesthesia, and operating staff, and all were in agreement as to the patient, procedure, and site of procedure.  Tourniquet at the proximal aspect of the forearm was inflated to 250 mmHg after exsanguination of the limb with an Esmarch bandage.  Incision was made longitudinally over the transverse carpal ligament.  This was carried into subcutaneous tissues by spreading technique.  Bipolar electrocautery was used to obtain hemostasis.  The transverse carpal ligament was identified and was incised sharply at its distal direction.  Care was taken to ensure complete decompression distally.  It was then incised proximally.  The scissors were used to split the distal aspect of the volar antebrachial fascia.  A finger was placed into the wound to ensure complete decompression, which was the case.  The nerve was inspected.  It was adherent to the undersurface of the radial leaflet of the transverse carpal ligament.  The motor branch was identified and was intact.  The wound was copiously  irrigated with sterile saline.  It was closed with 4-0 nylon in a horizontal mattress fashion.  It was injected with 10 mL of 0.25% plain Marcaine to aid in postoperative analgesia.  It was then dressed with sterile Xeroform, 4x4s, and ABD and wrapped with Kerlix and Ace bandage.  Tourniquet was deflated at 15 minutes.  The fingertips were pink with brisk capillary refill after deflation of tourniquet.  Operative drapes were broken down.  The patient was awoken from anesthesia safely.  She was transferred back to the stretcher and taken to PACU in stable condition. I will see her back in the office in  1 week for postoperative followup. I will give her Norco 5/325 1-2 p.o. q.6 h. p.r.n. pain, dispensed #30.     Betha Loa, MD     KK/MEDQ  D:  12/22/2011  T:  12/23/2011  Job:  [id number]

## 2012-02-03 ENCOUNTER — Other Ambulatory Visit: Payer: Self-pay | Admitting: Orthopedic Surgery

## 2012-02-04 ENCOUNTER — Encounter (HOSPITAL_BASED_OUTPATIENT_CLINIC_OR_DEPARTMENT_OTHER): Payer: Self-pay | Admitting: *Deleted

## 2012-02-12 ENCOUNTER — Ambulatory Visit (HOSPITAL_BASED_OUTPATIENT_CLINIC_OR_DEPARTMENT_OTHER): Payer: Managed Care, Other (non HMO) | Admitting: Anesthesiology

## 2012-02-12 ENCOUNTER — Encounter (HOSPITAL_BASED_OUTPATIENT_CLINIC_OR_DEPARTMENT_OTHER): Payer: Self-pay | Admitting: Anesthesiology

## 2012-02-12 ENCOUNTER — Encounter (HOSPITAL_BASED_OUTPATIENT_CLINIC_OR_DEPARTMENT_OTHER): Payer: Self-pay

## 2012-02-12 ENCOUNTER — Encounter (HOSPITAL_BASED_OUTPATIENT_CLINIC_OR_DEPARTMENT_OTHER)
Admission: RE | Disposition: A | Discharge status: Home / Self Care | Payer: Self-pay | Source: Ambulatory Visit | Attending: Orthopedic Surgery

## 2012-02-12 ENCOUNTER — Ambulatory Visit (HOSPITAL_BASED_OUTPATIENT_CLINIC_OR_DEPARTMENT_OTHER)
Admission: RE | Admit: 2012-02-12 | Discharge: 2012-02-12 | Disposition: A | Discharge status: Home / Self Care | Payer: Managed Care, Other (non HMO) | Source: Ambulatory Visit | Attending: Orthopedic Surgery | Admitting: Orthopedic Surgery

## 2012-02-12 DIAGNOSIS — M6740 Ganglion, unspecified site: Secondary | ICD-10-CM | POA: Insufficient documentation

## 2012-02-12 HISTORY — PX: MASS EXCISION: SHX2000

## 2012-02-12 HISTORY — DX: Ganglion, left hand: M67.442

## 2012-02-12 LAB — POCT HEMOGLOBIN-HEMACUE: Hemoglobin: 14.6 g/dL (ref 12.0–15.0)

## 2012-02-12 SURGERY — EXCISION MASS
Anesthesia: General | Site: Hand | Laterality: Left | Wound class: Clean

## 2012-02-12 MED ORDER — CHLORHEXIDINE GLUCONATE 4 % EX LIQD: 60.0000 mL | Freq: Once | CUTANEOUS | Status: DC

## 2012-02-12 MED ORDER — BUPIVACAINE HCL (PF) 0.25 % IJ SOLN
INTRAMUSCULAR | Status: DC | PRN
  Administered 2012-02-12: 5 mL

## 2012-02-12 MED ORDER — FENTANYL CITRATE 0.05 MG/ML IJ SOLN
INTRAMUSCULAR | Status: DC | PRN
  Administered 2012-02-12: 100 ug via INTRAVENOUS

## 2012-02-12 MED ORDER — OXYCODONE HCL 5 MG PO TABS: 5.0000 mg | ORAL_TABLET | Freq: Once | ORAL | Status: DC | PRN

## 2012-02-12 MED ORDER — HYDROCODONE-ACETAMINOPHEN 5-325 MG PO TABS: ORAL_TABLET | ORAL | Status: DC

## 2012-02-12 MED ORDER — HYDROMORPHONE HCL PF 1 MG/ML IJ SOLN: 0.2500 mg | INTRAMUSCULAR | Status: DC | PRN

## 2012-02-12 MED ORDER — MEPERIDINE HCL 25 MG/ML IJ SOLN: 6.2500 mg | INTRAMUSCULAR | Status: DC | PRN

## 2012-02-12 MED ORDER — MIDAZOLAM HCL 5 MG/5ML IJ SOLN
INTRAMUSCULAR | Status: DC | PRN
  Administered 2012-02-12: 2 mg via INTRAVENOUS

## 2012-02-12 MED ORDER — ONDANSETRON HCL 4 MG/2ML IJ SOLN
INTRAMUSCULAR | Status: DC | PRN
  Administered 2012-02-12: 4 mg via INTRAVENOUS

## 2012-02-12 MED ORDER — OXYCODONE HCL 5 MG/5ML PO SOLN: 5.0000 mg | Freq: Once | ORAL | Status: DC | PRN

## 2012-02-12 MED ORDER — LACTATED RINGERS IV SOLN
INTRAVENOUS | Status: DC
  Administered 2012-02-12: 08:00:00 via INTRAVENOUS

## 2012-02-12 MED ORDER — CEFAZOLIN SODIUM-DEXTROSE 2-3 GM-% IV SOLR
2.0000 g | Freq: Once | INTRAVENOUS | Status: AC
  Administered 2012-02-12: 2 g via INTRAVENOUS

## 2012-02-12 MED ORDER — DEXAMETHASONE SODIUM PHOSPHATE 10 MG/ML IJ SOLN
INTRAMUSCULAR | Status: DC | PRN
  Administered 2012-02-12: 10 mg via INTRAVENOUS

## 2012-02-12 MED ORDER — ONDANSETRON HCL 4 MG/2ML IJ SOLN: 4.0000 mg | Freq: Once | INTRAMUSCULAR | Status: DC | PRN

## 2012-02-12 SURGICAL SUPPLY — 52 items
BANDAGE COBAN STERILE 2 (GAUZE/BANDAGES/DRESSINGS) ×2 IMPLANT
BANDAGE CONFORM 2  STR LF (GAUZE/BANDAGES/DRESSINGS) IMPLANT
BANDAGE CONFORM 3  STR LF (GAUZE/BANDAGES/DRESSINGS) ×2 IMPLANT
BANDAGE ELASTIC 3 VELCRO ST LF (GAUZE/BANDAGES/DRESSINGS) IMPLANT
BANDAGE GAUZE ELAST BULKY 4 IN (GAUZE/BANDAGES/DRESSINGS) IMPLANT
BANDAGE GAUZE STRT 1 STR LF (GAUZE/BANDAGES/DRESSINGS) IMPLANT
BENZOIN TINCTURE PRP APPL 2/3 (GAUZE/BANDAGES/DRESSINGS) IMPLANT
BLADE MINI RND TIP GREEN BEAV (BLADE) IMPLANT
BLADE SURG 15 STRL LF DISP TIS (BLADE) ×2 IMPLANT
BLADE SURG 15 STRL SS (BLADE) ×2
BNDG COHESIVE 1X5 TAN STRL LF (GAUZE/BANDAGES/DRESSINGS) IMPLANT
BNDG ELASTIC 2 VLCR STRL LF (GAUZE/BANDAGES/DRESSINGS) IMPLANT
BNDG ESMARK 4X9 LF (GAUZE/BANDAGES/DRESSINGS) IMPLANT
BNDG PLASTER X FAST 3X3 WHT LF (CAST SUPPLIES) IMPLANT
CHLORAPREP W/TINT 26ML (MISCELLANEOUS) ×2 IMPLANT
CLOTH BEACON ORANGE TIMEOUT ST (SAFETY) ×2 IMPLANT
CORDS BIPOLAR (ELECTRODE) ×2 IMPLANT
COVER MAYO STAND STRL (DRAPES) ×2 IMPLANT
COVER TABLE BACK 60X90 (DRAPES) ×2 IMPLANT
CUFF TOURNIQUET SINGLE 18IN (TOURNIQUET CUFF) ×2 IMPLANT
DRAPE EXTREMITY T 121X128X90 (DRAPE) ×2 IMPLANT
DRAPE SURG 17X23 STRL (DRAPES) ×2 IMPLANT
GAUZE SPONGE 4X4 12PLY STRL LF (GAUZE/BANDAGES/DRESSINGS) ×2 IMPLANT
GAUZE XEROFORM 1X8 LF (GAUZE/BANDAGES/DRESSINGS) ×2 IMPLANT
GLOVE BIO SURGEON STRL SZ7.5 (GLOVE) ×2 IMPLANT
GLOVE BIOGEL M STRL SZ7.5 (GLOVE) ×2 IMPLANT
GLOVE BIOGEL PI IND STRL 8 (GLOVE) ×2 IMPLANT
GLOVE BIOGEL PI INDICATOR 8 (GLOVE) ×2
GOWN PREVENTION PLUS XLARGE (GOWN DISPOSABLE) IMPLANT
GOWN PREVENTION PLUS XXLARGE (GOWN DISPOSABLE) ×2 IMPLANT
GOWN STRL REIN XL XLG (GOWN DISPOSABLE) ×2 IMPLANT
NEEDLE HYPO 25X1 1.5 SAFETY (NEEDLE) ×2 IMPLANT
NS IRRIG 1000ML POUR BTL (IV SOLUTION) ×2 IMPLANT
PACK BASIN DAY SURGERY FS (CUSTOM PROCEDURE TRAY) ×2 IMPLANT
PAD CAST 3X4 CTTN HI CHSV (CAST SUPPLIES) IMPLANT
PAD CAST 4YDX4 CTTN HI CHSV (CAST SUPPLIES) IMPLANT
PADDING CAST ABS 4INX4YD NS (CAST SUPPLIES) ×1
PADDING CAST ABS COTTON 4X4 ST (CAST SUPPLIES) ×1 IMPLANT
PADDING CAST COTTON 3X4 STRL (CAST SUPPLIES)
PADDING CAST COTTON 4X4 STRL (CAST SUPPLIES)
SPONGE GAUZE 4X4 12PLY (GAUZE/BANDAGES/DRESSINGS) ×2 IMPLANT
STOCKINETTE 4X48 STRL (DRAPES) ×2 IMPLANT
STRIP CLOSURE SKIN 1/2X4 (GAUZE/BANDAGES/DRESSINGS) ×2 IMPLANT
SUT ETHILON 3 0 PS 1 (SUTURE) IMPLANT
SUT ETHILON 4 0 PS 2 18 (SUTURE) ×2 IMPLANT
SUT MNCRL AB 4-0 PS2 18 (SUTURE) ×2 IMPLANT
SUT VIC AB 4-0 P2 18 (SUTURE) ×2 IMPLANT
SYR BULB 3OZ (MISCELLANEOUS) ×2 IMPLANT
SYR CONTROL 10ML LL (SYRINGE) ×2 IMPLANT
TOWEL OR 17X24 6PK STRL BLUE (TOWEL DISPOSABLE) ×2 IMPLANT
UNDERPAD 30X30 INCONTINENT (UNDERPADS AND DIAPERS) ×2 IMPLANT
WATER STERILE IRR 1000ML POUR (IV SOLUTION) IMPLANT

## 2012-02-12 NOTE — H&P (Signed)
  Brandy Tyler is an 26 y.o. female.   Chief Complaint: left hand mass HPI: 26 yo female with left hand mass on dorsum.  Bothersome to her.  She would like it removed.  Past Medical History  Diagnosis Date  . Bursitis of hand     right hand - also tendonitis  . 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 Abrasion of arm, right 12/19/2011  . Ganglion of left hand     Past Surgical History  Procedure Date  . Carpal tunnel release 12/22/2011    Procedure: CARPAL TUNNEL RELEASE;  Surgeon: Tami Ribas, MD;  Location: [facility];  Service: Orthopedics;  Laterality: Right;    Family History  Problem Relation Age of Onset  . Lupus Mother   . Stroke Other   . Rheum arthritis Other    Social History:  reports that she has never smoked. She does not have any smokeless tobacco history on file. She reports that she drinks alcohol. She reports that she does not use illicit drugs.  Allergies: No Known Allergies  Medications Prior to Admission  Medication Sig Dispense Refill  . Multiple Vitamins-Minerals (MULTIVITAMINS THER. W/MINERALS) TABS Take 1 tablet by mouth daily.        . medroxyPROGESTERone (DEPO-PROVERA) 150 MG/ML injection Inject 150 mg into the muscle every 3 (three) months. Birth control        No results found for this or any previous visit (from the past 48 hour(s)).  No results found.   A comprehensive review of systems was negative except for: Eyes: positive for contacts/glasses  Blood pressure 117/77, pulse 90, temperature 98.3 F (36.8 C), temperature source Oral, resp. rate 20, height 4\' 10"  (1.473 m), weight 54.885 kg (121 lb), SpO2 100.00%.  General appearance: alert, cooperative and appears stated age Head: Normocephalic, without obvious abnormality, atraumatic Neck: supple, symmetrical, trachea midline Resp: clear to auscultation bilaterally Cardio: regular rate and rhythm GI: non tender Extremities: intact sensation and capillary refill  all digits.  +epl/fpl/io.  mass on dorsum of left hand.  transilluminates.  no erythema. Pulses: 2+ and symmetric Skin: Skin color, texture, turgor normal. No rashes or lesions Neurologic: Grossly normal Incision/Wound: na  Assessment/Plan Left hand dorsal ganglion cyst.  Non operative and operative treatment options were discussed with the patient and patient wishes to proceed with operative treatment. Risks, benefits, and alternatives of surgery were discussed and the patient agrees with the plan of care.   Odessa Morren R 02/12/2012, 8:37 AM

## 2012-02-12 NOTE — Anesthesia Preprocedure Evaluation (Signed)

## 2012-02-12 NOTE — Anesthesia Postprocedure Evaluation (Signed)
Anesthesia Post Note  Patient: Brandy Tyler  Procedure(s) Performed: Procedure(s) (LRB): EXCISION MASS (Left)  Anesthesia type: general  Patient location: PACU  Post pain: Pain level controlled  Post assessment: Patient's Cardiovascular Status Stable  Last Vitals:  Filed Vitals:   02/12/12 0931  BP: 117/94  Pulse: 88  Temp: 36.5 C  Resp: 14    Post vital signs: Reviewed and stable  Level of consciousness: sedated  Complications: No apparent anesthesia complications

## 2012-02-12 NOTE — Anesthesia Procedure Notes (Signed)
Procedure Name: LMA Insertion Date/Time: 02/12/2012 8:55 AM Performed by: Burna Cash Pre-anesthesia Checklist: Patient identified, Emergency Drugs available, Suction available and Patient being monitored Patient Re-evaluated:Patient Re-evaluated prior to inductionOxygen Delivery Method: Circle System Utilized Preoxygenation: Pre-oxygenation with 100% oxygen Intubation Type: IV induction Ventilation: Mask ventilation without difficulty LMA: LMA inserted LMA Size: 4.0 Number of attempts: 1 Airway Equipment and Method: bite block Placement Confirmation: positive ETCO2 Tube secured with: Tape Dental Injury: Teeth and Oropharynx as per pre-operative assessment

## 2012-02-12 NOTE — Transfer of Care (Signed)
Immediate Anesthesia Transfer of Care Note  Patient: Brandy Tyler  Procedure(s) Performed: Procedure(s) (LRB) with comments: EXCISION MASS (Left) - LEFT HAND EXCISION MASS  Patient Location: PACU  Anesthesia Type:General  Level of Consciousness: sedated  Airway & Oxygen Therapy: Patient Spontanous Breathing and Patient connected to face mask oxygen  Post-op Assessment: Report given to PACU RN and Post -op Vital signs reviewed and stable  Post vital signs: Reviewed and stable  Complications: No apparent anesthesia complications

## 2012-02-12 NOTE — Op Note (Signed)
Dictation [phone]

## 2012-02-13 ENCOUNTER — Encounter (HOSPITAL_BASED_OUTPATIENT_CLINIC_OR_DEPARTMENT_OTHER): Payer: Self-pay | Admitting: Orthopedic Surgery

## 2012-02-13 NOTE — Op Note (Signed)
[name], [name] NO.:  [id number]  MEDICAL RECORD NO.:  [id number]  LOCATION:                                 FACILITY:  PHYSICIAN:  Betha Loa, MD        DATE OF BIRTH:  1985/10/21  DATE OF PROCEDURE:  02/12/2012 DATE OF DISCHARGE:                              OPERATIVE REPORT   PREOPERATIVE DIAGNOSIS:  Left hand dorsal mass.  POSTOPERATIVE DIAGNOSIS:  Left hand intratendinous ganglion.  PROCEDURE:  Excision of mass, left hand.  SURGEON:  Betha Loa, MD  ASSISTANT:  None.  ANESTHESIA:  General.  IV FLUIDS:  Per anesthesia flow sheet.  ESTIMATED BLOOD LOSS:  Minimal.  COMPLICATIONS:  None.  SPECIMENS:  Left hand mass to Pathology.  DISPOSITION:  Stable to PACU.  INDICATIONS:  Brandy Tyler is a 26 year old female who has noted a mass on the dorsum of her left hand.  This has been bothersome to her.  She would like it excised.  Risks, benefits, and alternatives of excision were discussed including risk of blood loss, infection, damage to nerves, vessels, tendons, ligaments, bone; failure of surgery; need for additional surgery, complications with wound healing; continued pain; recurrence of mass.  She voiced understanding of these risks and elected to proceed.  OPERATIVE COURSE:  After being identified preoperatively by myself, the patient and I agreed upon the procedure and site of procedure.  Surgical site was marked.  The risks, benefits, and alternatives of surgery were reviewed and she wished to proceed.  Surgical consent had been signed. She was given 2 g of IV Ancef as a preoperative antibiotic prophylaxis. She was transported to the operating room, placed on the operating room table in a supine position with left upper extremity on arm board. General anesthesia was induced by the anesthesiologist.  Left upper extremity was prepped and draped in normal sterile orthopedic fashion. Surgical pause was performed between surgeons,  anesthesia, and operating staff, and all were in agreement as to the patient, procedure, and site of procedure.  Tourniquet at the proximal aspect of the extremity was inflated to 250 mmHg after exsanguination of the limb with an Esmarch bandage.  A longitudinal incision was made over the mass and carried into subcutaneous tissues by spreading technique.  All neurovascular structures were protected.  The mass was easily identified.  It appeared to be a ganglion within the tendon substance of the EDC to the ring finger.  It was freed of its soft tissue attachments.  There was clear gelatinous fluid within it.  It was excised in its entirety.  It coursed approximately a short distance within the tendon.  This was removed. The mass was sent to Pathology for examination.  The wound was copiously irrigated with sterile saline.  It was closed with 4-0 Vicryl suture in the subcutaneous tissues in an inverted interrupted fashion and the skin was closed with 4-0 Monocryl in a running subcuticular fashion.  This was augmented with Steri-Strips.  The wound was injected with 5 mL of 0.25% plain Marcaine to aid in postoperative analgesia.  It was then dressed with sterile Xeroform, 4x4s, and wrapped with a Kling and Coban  dressing lightly.  Tourniquet was deflated at 23 minutes.  The fingertips were pink with brisk capillary refill after deflation of the tourniquet.  Operative drapes were broken down and the patient was awoken from anesthesia safely.  She was transferred back to the stretcher and taken to PACU in stable condition.  I will see her back in the office in 1 week for postoperative followup.  I will give her Norco 5/325, 1-2 p.o. q.6 hours p.r.n. pain, dispensed #30.     Betha Loa, MD     KK/MEDQ  D:  02/12/2012  T:  02/13/2012  Job:  [id number]

## 2012-09-22 IMAGING — CT CT ANGIO CHEST
2 of 8 series · 19 of 46 positions shown · IV contrast (APPLIED)
Comparison: Chest radiograph performed earlier today at [DATE] a.m.

CLINICAL DATA: Sudden sharp left-sided chest pain, extending under
left arm.

CT ANGIOGRAPHY CHEST WITH CONTRAST
TECHNIQUE: Multidetector CT imaging of the chest was performed
using the standard protocol during bolus administration of
intravenous contrast.  Multiplanar CT image reconstructions
including MIPs were obtained to evaluate the vascular anatomy.
Contrast: 100mL OMNIPAQUE IOHEXOL 350 MG/ML IV SOLN

[Series 7: pulm embolism 0.75 b25f thin · axial · 0.53mm/px · z∈[-299,-27]mm · 16 of 1001 slices shown]
[im 48/1001  lung]
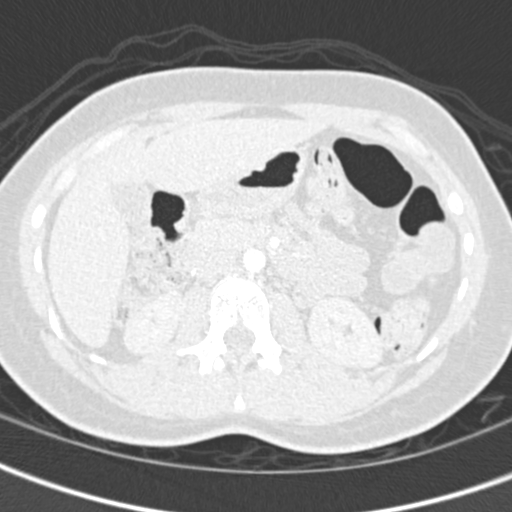
[im 96/1001  soft-tissue]
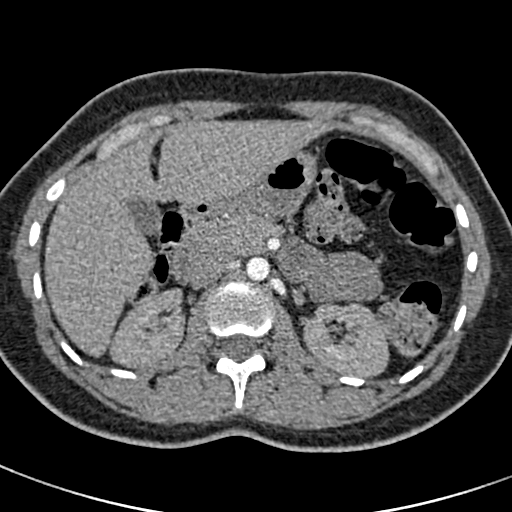
[im 191/1001  lung]
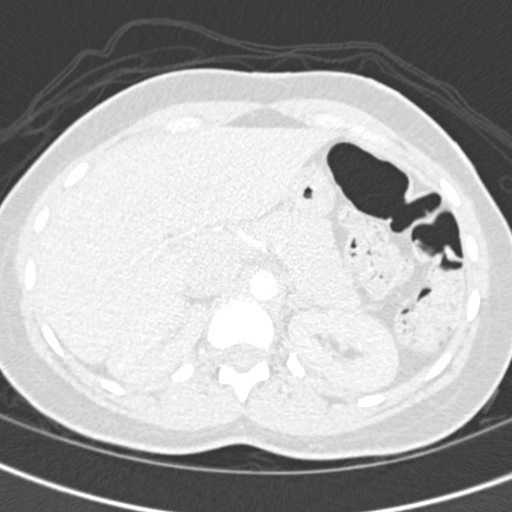
[im 239/1001  soft-tissue]
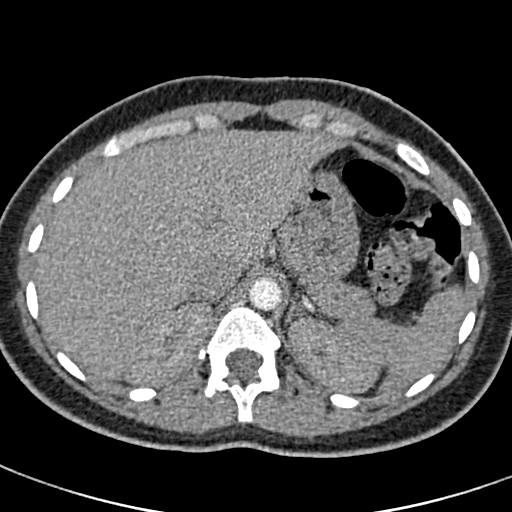
[im 286/1001  lung]
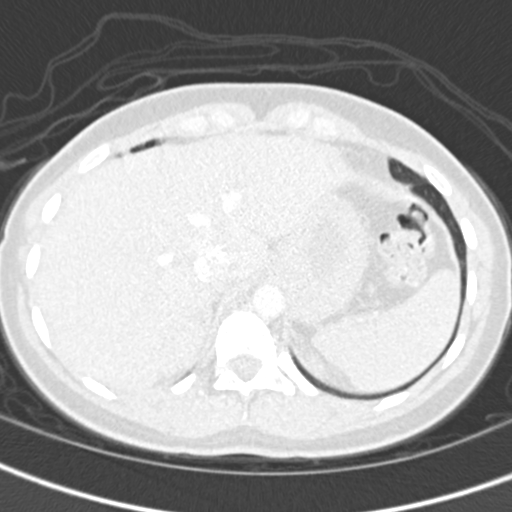
[im 334/1001  soft-tissue]
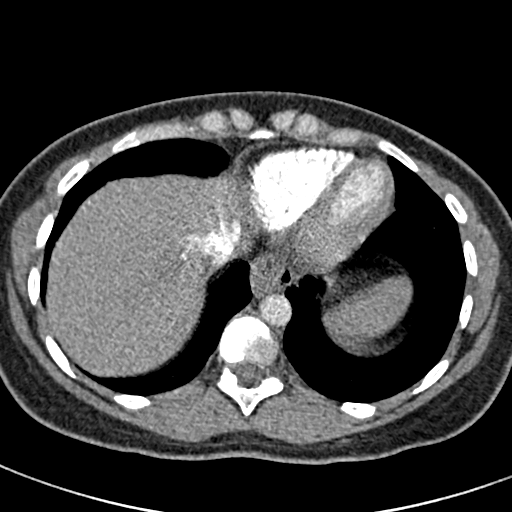
[im 429/1001  lung]
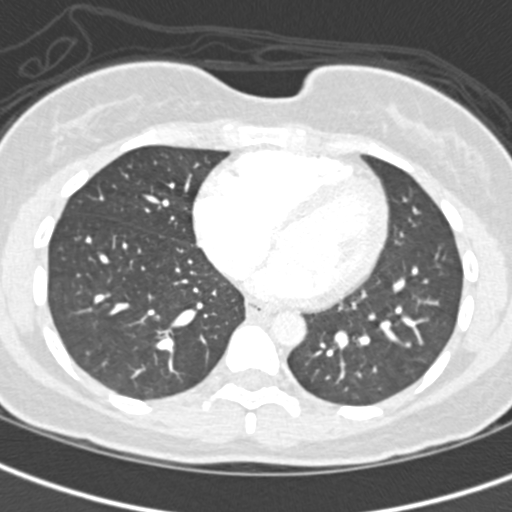
[im 477/1001  soft-tissue]
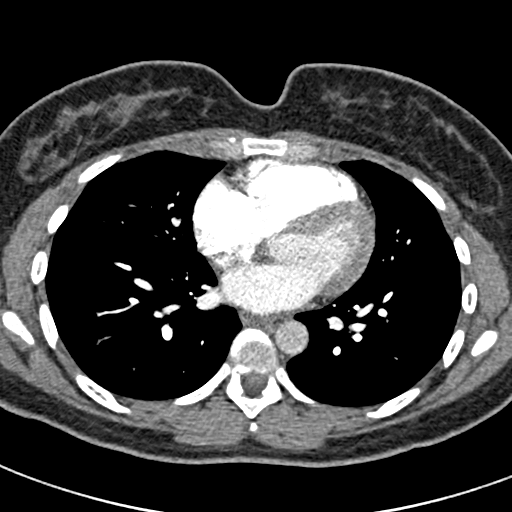
[im 524/1001  lung]
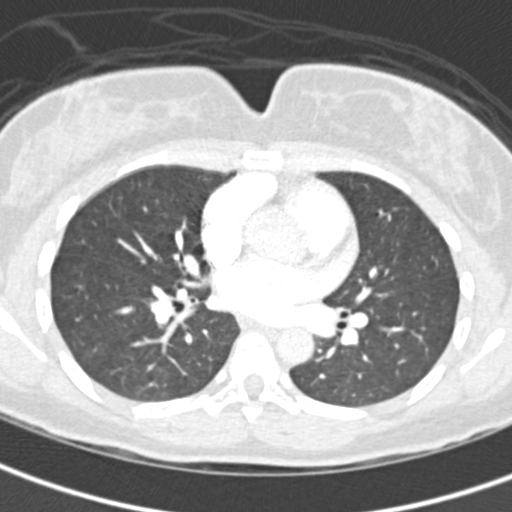
[im 572/1001  soft-tissue]
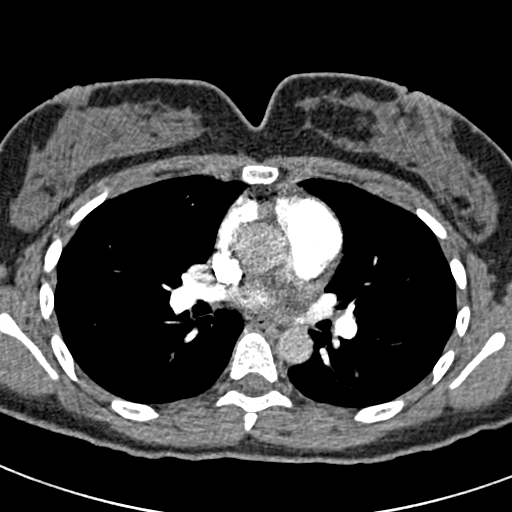
[im 667/1001  lung]
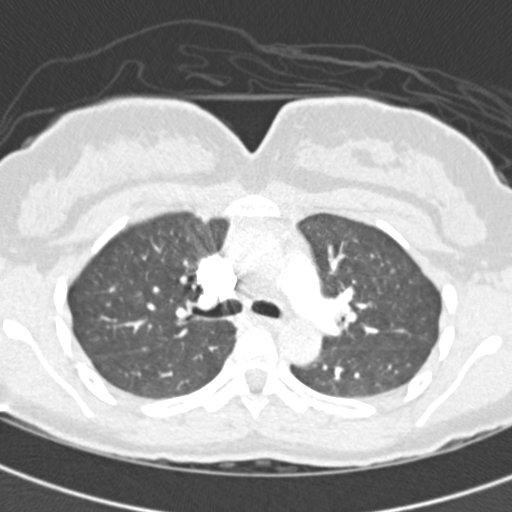
[im 715/1001  soft-tissue]
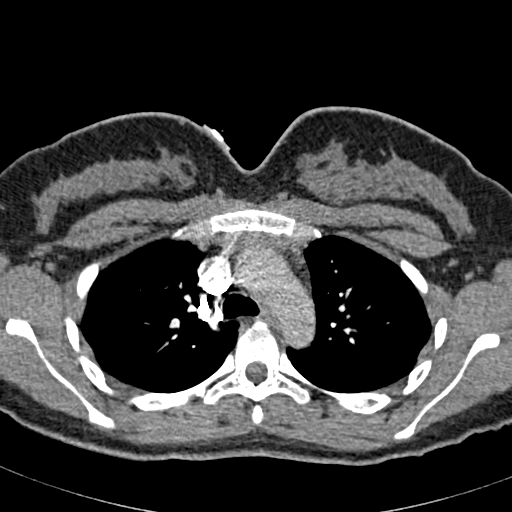
[im 762/1001  lung]
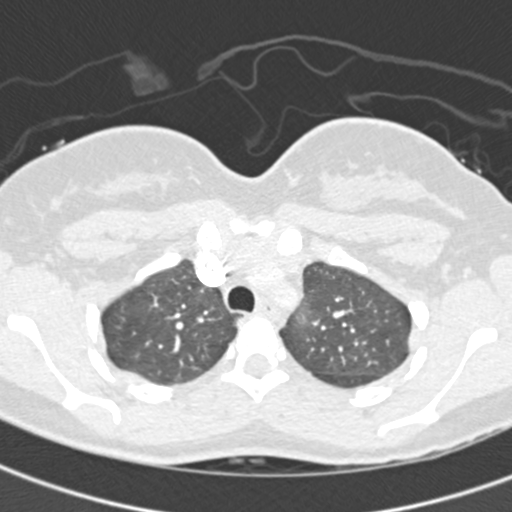
[im 810/1001  soft-tissue]
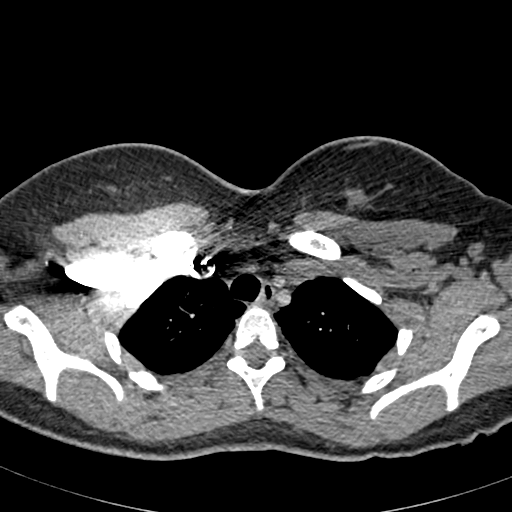
[im 905/1001  lung]
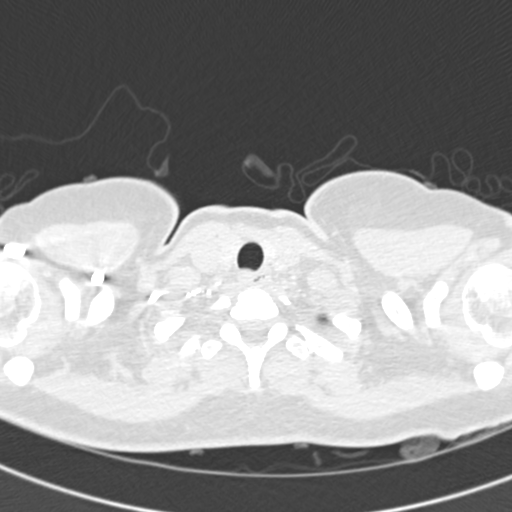
[im 953/1001  soft-tissue]
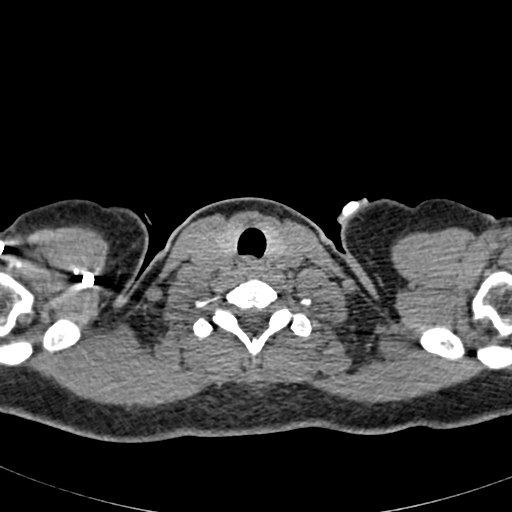

[Series 602: coronal · coronal · 0.59mm/px · 3 of 88 slices shown]
[im 22/88  soft-tissue]
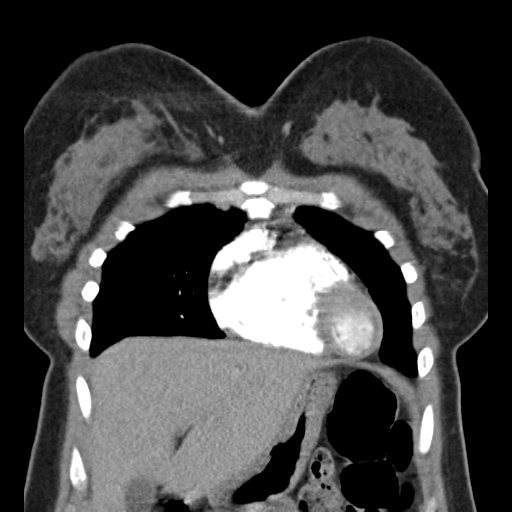
[im 44/88  soft-tissue]
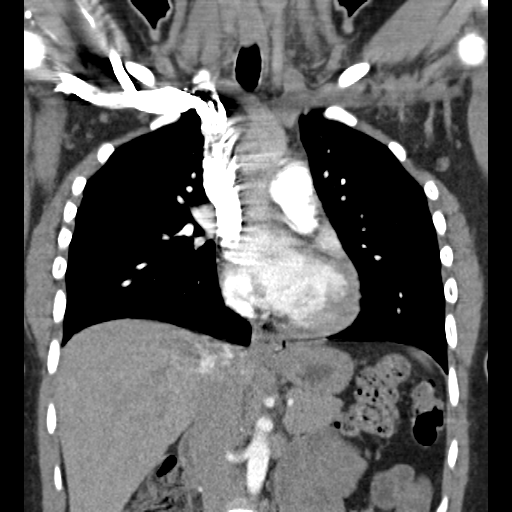
[im 66/88  soft-tissue]
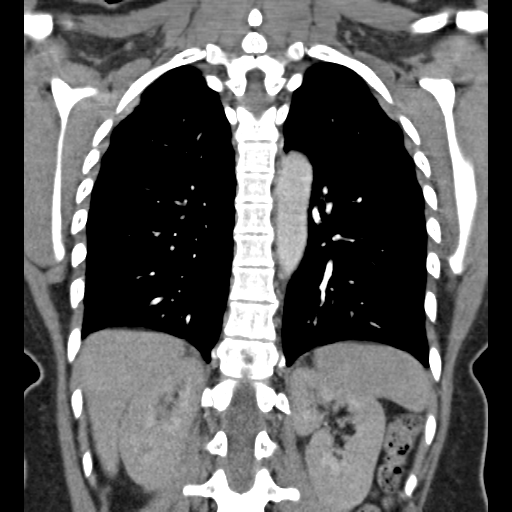

[19 of 46 positions shown; findings below may reference images not displayed]

FINDINGS: There is no evidence of pulmonary embolus.

The lungs are clear bilaterally.  There is no evidence of
significant focal consolidation, pleural effusion or pneumothorax.
No masses are identified; no abnormal focal contrast enhancement is
seen.

The mediastinum is unremarkable in appearance.  No mediastinal
lymphadenopathy is seen.  No pericardial effusion is identified.
The great vessels are unremarkable in appearance.  Residual thymic
tissue is within normal limits.  No axillary lymphadenopathy is
seen.  The thyroid gland is unremarkable in appearance.

The visualized portions of the liver and spleen are unremarkable.
The visualized portions of the pancreas, gallbladder, adrenal
glands and kidneys are within normal limits.  Note is made of
reflux of contrast into the IVC and hepatic veins.  A tiny hiatal
hernia is seen.

No acute osseous abnormalities are seen.

Review of the MIP images confirms the above findings.
IMPRESSION: 1.  No evidence of pulmonary embolus.
2.  Lungs clear bilaterally.
3.  Tiny hiatal hernia noted.

## 2014-01-17 ENCOUNTER — Emergency Department (INDEPENDENT_AMBULATORY_CARE_PROVIDER_SITE_OTHER)
Admission: EM | Admit: 2014-01-17 | Discharge: 2014-01-17 | Disposition: A | Discharge status: Home / Self Care | Payer: 59 | Source: Home / Self Care | Attending: Emergency Medicine | Admitting: Emergency Medicine

## 2014-01-17 ENCOUNTER — Encounter (HOSPITAL_COMMUNITY): Payer: Self-pay | Admitting: Emergency Medicine

## 2014-01-17 DIAGNOSIS — F458 Other somatoform disorders: Secondary | ICD-10-CM

## 2014-01-17 DIAGNOSIS — K219 Gastro-esophageal reflux disease without esophagitis: Secondary | ICD-10-CM

## 2014-01-17 DIAGNOSIS — R0989 Other specified symptoms and signs involving the circulatory and respiratory systems: Secondary | ICD-10-CM

## 2014-01-17 MED ORDER — OMEPRAZOLE 20 MG PO CPDR: 20.0000 mg | DELAYED_RELEASE_CAPSULE | Freq: Two times a day (BID) | ORAL | Status: DC

## 2014-01-17 NOTE — ED Notes (Signed)
C/o throat swelling onset yesterday.  States it feels like someone is choking her at the base of her neck and feels pressure on the back of her tongue.  Feels like something in her throat at all times.

## 2014-01-17 NOTE — Discharge Instructions (Signed)
Gastroesophageal Reflux Disease, Adult Gastroesophageal reflux disease (GERD) happens when acid from your stomach flows up into the esophagus. When acid comes in contact with the esophagus, the acid causes soreness (inflammation) in the esophagus. Over time, GERD may create small holes (ulcers) in the lining of the esophagus. CAUSES   Increased body weight. This puts pressure on the stomach, making acid rise from the stomach into the esophagus.  Smoking. This increases acid production in the stomach.  Drinking alcohol. This causes decreased pressure in the lower esophageal sphincter (valve or ring of muscle between the esophagus and stomach), allowing acid from the stomach into the esophagus.  Late evening meals and a full stomach. This increases pressure and acid production in the stomach.  A malformed lower esophageal sphincter. Sometimes, no cause is found. SYMPTOMS   Burning pain in the lower part of the mid-chest behind the breastbone and in the mid-stomach area. This may occur twice a week or more often.  Trouble swallowing.  Sore throat.  Dry cough.  Asthma-like symptoms including chest tightness, shortness of breath, or wheezing. DIAGNOSIS  Your caregiver may be able to diagnose GERD based on your symptoms. In some cases, X-rays and other tests may be done to check for complications or to check the condition of your stomach and esophagus. TREATMENT  Your caregiver may recommend over-the-counter or prescription medicines to help decrease acid production. Ask your caregiver before starting or adding any new medicines.  HOME CARE INSTRUCTIONS   Change the factors that you can control. Ask your caregiver for guidance concerning weight loss, quitting smoking, and alcohol consumption.  Avoid foods and drinks that make your symptoms worse, such as:  Caffeine or alcoholic drinks.  Chocolate.  Peppermint or mint flavorings.  Garlic and onions.  Spicy foods.  Citrus fruits,  such as oranges, lemons, or limes.  Tomato-based foods such as sauce, chili, salsa, and pizza.  Fried and fatty foods.  Avoid lying down for the 3 hours prior to your bedtime or prior to taking a nap.  Eat small, frequent meals instead of large meals.  Wear loose-fitting clothing. Do not wear anything tight around your waist that causes pressure on your stomach.  Raise the head of your bed 6 to 8 inches with wood blocks to help you sleep. Extra pillows will not help.  Only take over-the-counter or prescription medicines for pain, discomfort, or fever as directed by your caregiver.  Do not take aspirin, ibuprofen, or other nonsteroidal anti-inflammatory drugs (NSAIDs). SEEK IMMEDIATE MEDICAL CARE IF:   You have pain in your arms, neck, jaw, teeth, or back.  Your pain increases or changes in intensity or duration.  You develop nausea, vomiting, or sweating (diaphoresis).  You develop shortness of breath, or you faint.  Your vomit is green, yellow, black, or looks like coffee grounds or blood.  Your stool is red, bloody, or black. These symptoms could be signs of other problems, such as heart disease, gastric bleeding, or esophageal bleeding. MAKE SURE YOU:   Understand these instructions.  Will watch your condition.  Will get help right away if you are not doing well or get worse. Document Released: 01/08/2005 Document Revised: 06/23/2011 Document Reviewed: 10/18/2010 [facility] Patient Information 2015 [place], [place]. This information is not intended to replace advice given to you by your health care provider. Make sure you discuss any questions you have with your health care provider.  Globus Syndrome Globus Syndrome is a feeling of a lump or a sensation of something  caught in your throat. Eating food or drinking fluids does not seem to get rid of it. Yet it is not noticeable during the actual act of swallowing food or liquids. Usually there is nothing physically wrong. It is  troublesome because it is an unpleasant sensation which is sometimes difficult to ignore and at times may seem to worsen. The syndrome is quite common. It is estimated 45% of the population experiences features of the condition at some stage during their lives. The symptoms are usually temporary. The largest group of people who feel the need to seek medical treatment is females between the ages of 5630 to 1860.  CAUSES  Globus Syndrome appears to be triggered by or aggravated by stress, anxiety and depression.  Tension related to stress could product abnormal muscle spasms in the esophagus which would account for the sensation of a lump or ball in your throat.  Frequent swallowing or drying of the throat caused by anxiety or other strong emotions can also produce this uncomfortable sensation in your throat.  Fear and sadness can be expressed by the body in many ways. For instance, if you had a relative with throat cancer you might become overly concerned about your own health and develop uncomfortable sensations in your throat.  The reaction to a crisis or a trauma event in your life can take the form of a lump in your throat. It is as if you are indirectly saying you can not handle or "swallow" one more thing. DIAGNOSIS  Usually your caregiver will know what is wrong by talking to you and examining you. If the condition persists for several days, more testing may be done to make sure there is not another problem present. This is usually not the case. TREATMENT   Reassurance is often the best treatment available. Usually the problem leaves without treatment over several days.  Sometimes anti-anxiety medications may be prescribed.  Counseling or talk therapy can also help with strong underlying emotions.  Note that in most cases this is not something that keeps coming back and you should not be concerned or worried. Document Released: 06/21/2003 Document Revised: 06/23/2011 Document Reviewed:  11/18/2007 [facility] Patient Information 2015 [place]. This information is not intended to replace advice given to you by your health care provider. Make sure you discuss any questions you have with your health care provider.

## 2014-01-17 NOTE — ED Provider Notes (Signed)
Medical screening examination/treatment/procedure(s) were performed by non-physician practitioner and as supervising physician I was immediately available for consultation/collaboration.  Evi Mccomb, M.D.  Kyiesha Millward C Dalyn Becker, MD 01/17/14 2111 

## 2014-01-17 NOTE — ED Provider Notes (Signed)
CSN: [id number]     Arrival date & time 01/17/14  1917 History   First MD Initiated Contact with Patient 01/17/14 2013     Chief Complaint  Patient presents with  . Oral Swelling   (Consider location/radiation/quality/duration/timing/severity/associated sxs/prior Treatment) HPI Comments: Patient present with 2 day history of the sense of pressure at the base of her neck and fullness in her throat when she swallows. She is not experiencing any pain and reports no difficulty speaking, breathing or swallowing. Denies fever or recent injury. Denies any known injury. No unexplained changes in weight. No visible neck masses or swelling. Reports herself to be otherwise healthy. No changes in voice. States symptoms are most noticeable in the mornings or when she lies in a supine position.  Has not had these symptoms in the past.  Is a non-smoker Works in data entry No PCP  The history is provided by the patient.    Past Medical History  Diagnosis Date  . Bursitis of hand     right hand - also tendonitis  . 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 Abrasion of arm, right 12/19/2011  . Ganglion of left hand    Past Surgical History  Procedure Laterality Date  . Carpal tunnel release  12/22/2011    Procedure: CARPAL TUNNEL RELEASE;  Surgeon: Tami RibasKevin R Kuzma, MD;  Location: [facility];  Service: Orthopedics;  Laterality: Right;  . Mass excision  02/12/2012    Procedure: EXCISION MASS;  Surgeon: Tami RibasKevin R Kuzma, MD;  Location: [facility];  Service: Orthopedics;  Laterality: Left;  LEFT HAND EXCISION MASS   Family History  Problem Relation Age of Onset  . Lupus Mother   . Kidney failure Mother   . Stroke Other   . Rheum arthritis Other   . Heart murmur Father    History  Substance Use Topics  . Smoking status: Never Smoker   . Smokeless tobacco: Not on file  . Alcohol Use: Yes     Comment: occasionally   OB History   Grav Para Term Preterm Abortions TAB  SAB Ect Mult Living                 Review of Systems  All other systems reviewed and are negative.   Allergies  Review of patient's allergies indicates no known allergies.  Home Medications   Prior to Admission medications   Medication Sig Start Date End Date Taking? Authorizing Provider  Multiple Vitamins-Minerals (MULTIVITAMINS THER. W/MINERALS) TABS Take 1 tablet by mouth daily.     Yes Historical Provider, MD  HYDROcodone-acetaminophen [facility](NORCO) 5-325 MG per tablet 1-2 tabs po q6 hours prn pain 02/12/12   Betha LoaKevin Kuzma, MD  medroxyPROGESTERone (DEPO-PROVERA) 150 MG/ML injection Inject 150 mg into the muscle every 3 (three) months. Birth control    Historical Provider, MD  omeprazole (PRILOSEC) 20 MG capsule Take 1 capsule (20 mg total) by mouth 2 (two) times daily. 01/17/14   Jess BartersJennifer Lee H Mikiah Durall, PA   BP 134/94  Pulse 80  Temp(Src) 98.7 F (37.1 C) (Oral)  Resp 16  SpO2 100% Physical Exam  Nursing note and vitals reviewed. Constitutional: She is oriented to person, place, and time. She appears well-developed and well-nourished. No distress.  HENT:  Head: Normocephalic and atraumatic.  Right Ear: Hearing and external ear normal.  Left Ear: Hearing and external ear normal.  Nose: Nose normal.  Mouth/Throat: Uvula is midline, oropharynx is clear and moist and mucous  membranes are normal. No oral lesions. No trismus in the jaw. Normal dentition. No uvula swelling.  Eyes: Conjunctivae are normal. No scleral icterus.  Neck: Trachea normal, normal range of motion, full passive range of motion without pain and phonation normal. No JVD present. No tracheal tenderness, no spinous process tenderness and no muscular tenderness present. No tracheal deviation, no edema, no erythema and normal range of motion present. No mass and no thyromegaly present.  Cardiovascular: Normal rate, regular rhythm and normal heart sounds.   Pulmonary/Chest: Effort normal and breath sounds normal. No  stridor. No respiratory distress. She has no wheezes.  Musculoskeletal: Normal range of motion.  Lymphadenopathy:    She has no cervical adenopathy.  Neurological: She is alert and oriented to person, place, and time.  Skin: Skin is warm and dry. No rash noted. No erythema.  Psychiatric: She has a normal mood and affect. Her behavior is normal.    ED Course  Procedures (including critical care time) Labs Review Labs Reviewed - No data to display  Imaging Review No results found.   MDM   1. Globus sensation   2. Gastroesophageal reflux disease without esophagitis    Exam without worrisome finding. NO findings to suggest airway compromise. Speaks comfortably, at length in clear sentences. Would appear patient has sense of globus, perhaps from silent GERD while supine given that symptoms are often most notable in the morning. Will try conservative management with trial of acid suppression with Omeprazole 20mg  BID x 6 weeks. If symptoms persist, will encourage patient to follow up with ENT for further investigation. Patient voices a clear understanding that is symptoms become worse, suddenly worse or severe, she is to report directly to her nearest Emergency Room for evaluation.    Ria Clock, [place] 01/17/14 2042

## 2015-03-15 ENCOUNTER — Encounter: Payer: Self-pay | Admitting: Women's Health

## 2015-03-15 ENCOUNTER — Ambulatory Visit (INDEPENDENT_AMBULATORY_CARE_PROVIDER_SITE_OTHER): Payer: Managed Care, Other (non HMO) | Admitting: Women's Health

## 2015-03-15 ENCOUNTER — Other Ambulatory Visit (HOSPITAL_COMMUNITY)
Admission: RE | Admit: 2015-03-15 | Discharge: 2015-03-15 | Disposition: A | Discharge status: Home / Self Care | Payer: Managed Care, Other (non HMO) | Source: Ambulatory Visit | Attending: Women's Health | Admitting: Women's Health

## 2015-03-15 VITALS — BP 119/78 | Ht 60.0 in | Wt 133.8 lb

## 2015-03-15 DIAGNOSIS — Z13220 Encounter for screening for lipoid disorders: Secondary | ICD-10-CM

## 2015-03-15 DIAGNOSIS — Z01419 Encounter for gynecological examination (general) (routine) without abnormal findings: Secondary | ICD-10-CM | POA: Insufficient documentation

## 2015-03-15 DIAGNOSIS — Z3042 Encounter for surveillance of injectable contraceptive: Secondary | ICD-10-CM | POA: Diagnosis not present

## 2015-03-15 DIAGNOSIS — Z1329 Encounter for screening for other suspected endocrine disorder: Secondary | ICD-10-CM

## 2015-03-15 DIAGNOSIS — Z1151 Encounter for screening for human papillomavirus (HPV): Secondary | ICD-10-CM | POA: Insufficient documentation

## 2015-03-15 LAB — LIPID PANEL
CHOL/HDL RATIO: 4.3 ratio (ref <=5.0)
CHOLESTEROL: 188 mg/dL (ref 125–200)
HDL: 44 mg/dL — AB (ref >=46)
LDL Cholesterol: 131 mg/dL — ABNORMAL HIGH (ref <130)
TRIGLYCERIDES: 63 mg/dL (ref <150)
VLDL: 13 mg/dL (ref <30)

## 2015-03-15 LAB — COMPREHENSIVE METABOLIC PANEL
ALBUMIN: 4.3 g/dL (ref 3.6–5.1)
ALK PHOS: 36 U/L (ref 33–115)
ALT: 15 U/L (ref 6–29)
AST: 14 U/L (ref 10–30)
BUN: 10 mg/dL (ref 7–25)
CALCIUM: 9.2 mg/dL (ref 8.6–10.2)
CHLORIDE: 105 mmol/L (ref 98–110)
CO2: 24 mmol/L (ref 20–31)
Creat: 0.68 mg/dL (ref 0.50–1.10)
Glucose, Bld: 70 mg/dL (ref 65–99)
POTASSIUM: 3.9 mmol/L (ref 3.5–5.3)
Sodium: 139 mmol/L (ref 135–146)
TOTAL PROTEIN: 7.4 g/dL (ref 6.1–8.1)
Total Bilirubin: 0.5 mg/dL (ref 0.2–1.2)

## 2015-03-15 LAB — CBC WITH DIFFERENTIAL/PLATELET
BASOS ABS: 0.1 10*3/uL (ref 0.0–0.1)
Basophils Relative: 1 % (ref 0–1)
EOS ABS: 0.1 10*3/uL (ref 0.0–0.7)
Eosinophils Relative: 2 % (ref 0–5)
HEMATOCRIT: 40.5 % (ref 36.0–46.0)
Hemoglobin: 13.4 g/dL (ref 12.0–15.0)
LYMPHS ABS: 2.5 10*3/uL (ref 0.7–4.0)
LYMPHS PCT: 48 % — AB (ref 12–46)
MCH: 31.3 pg (ref 26.0–34.0)
MCHC: 33.1 g/dL (ref 30.0–36.0)
MCV: 94.6 fL (ref 78.0–100.0)
MONOS PCT: 14 % — AB (ref 3–12)
MPV: 9.7 fL (ref 8.6–12.4)
Monocytes Absolute: 0.7 10*3/uL (ref 0.1–1.0)
NEUTROS PCT: 35 % — AB (ref 43–77)
Neutro Abs: 1.9 10*3/uL (ref 1.7–7.7)
Platelets: 332 10*3/uL (ref 150–400)
RBC: 4.28 MIL/uL (ref 3.87–5.11)
RDW: 12.7 % (ref 11.5–15.5)
WBC: 5.3 10*3/uL (ref 4.0–10.5)

## 2015-03-15 MED ORDER — MEDROXYPROGESTERONE ACETATE 150 MG/ML IM SUSP: 150.0000 mg | INTRAMUSCULAR | Status: DC

## 2015-03-15 NOTE — Progress Notes (Signed)
Brandy Tyler 01/01/1986 [id number]    History:    Presents for annual exam.  Amenorrhea on Depo-Provera for several years. Reports an abnormal Pap in the past without needed treatment.  Past medical history, past surgical history, family history and social history were all reviewed and documented in the EPIC chart. Works a Office managerdesk job. 3 children ages 5011, 648 and 7 all doing well. Mother died of lupus at age 31.  ROS:  A ROS was performed and pertinent positives and negatives are included.  Exam:  Filed Vitals:   03/15/15 1603  BP: 119/78    General appearance:  Normal Thyroid:  Symmetrical, normal in size, without palpable masses or nodularity. Respiratory  Auscultation:  Clear without wheezing or rhonchi Cardiovascular  Auscultation:  Regular rate, without rubs, murmurs or gallops  Edema/varicosities:  Not grossly evident Abdominal  Soft,nontender, without masses, guarding or rebound.  Liver/spleen:  No organomegaly noted  Hernia:  None appreciated  Skin  Inspection:  Grossly normal,  numerous tattoos, [employer] tattoo   Breasts: Examined lying and sitting.     Right: Without masses, retractions, discharge or axillary adenopathy.     Left: Without masses, retractions, discharge or axillary adenopathy. Gentitourinary   Inguinal/mons:  Normal without inguinal adenopathy  External genitalia:  Normal  BUS/Urethra/Skene's glands:  Normal  Vagina:  Normal  Cervix:  Normal  Uterus:   normal in size, shape and contour.  Midline and mobile  Adnexa/parametria:     Rt: Without masses or tenderness.   Lt: Without masses or tenderness.  Anus and perineum: Normal  Digital rectal exam: Normal sphincter tone without palpated masses or tenderness  Assessment/Plan:  29 y.o. MBF G3 P3 for annual exam no complaints.  Amenorrhea on Depo-Provera  Plan: Contraception options reviewed and declined will continue Depo-Provera 150 every 12 weeks will return to office as needed. Last Depo Provera  end of October. SBE's, exercise, calcium rich diet, vitamin D 1000 daily encouraged. Decrease carbs for weight loss. CBC, CMP, lipid panel, TSH, UA, Pap with HR HPV typing, new screening guidelines reviewed.   Harrington ChallengerYOUNG,NANCY J Frederick Memorial HospitalWHNP, 4:58 PM 03/15/2015

## 2015-03-15 NOTE — Patient Instructions (Addendum)
Health Maintenance, Female Adopting a healthy lifestyle and getting preventive care can go a long way to promote health and wellness. Talk with your health care provider about what schedule of regular examinations is right for you. This is a good chance for you to check in with your provider about disease prevention and staying healthy. In between checkups, there are plenty of things you can do on your own. Experts have done a lot of research about which lifestyle changes and preventive measures are most likely to keep you healthy. Ask your health care provider for more information. WEIGHT AND DIET  Eat a healthy diet  Be sure to include plenty of vegetables, fruits, low-fat dairy products, and lean protein.  Do not eat a lot of foods high in solid fats, added sugars, or salt.  Get regular exercise. This is one of the most important things you can do for your health.  Most adults should exercise for at least 150 minutes each week. The exercise should increase your heart rate and make you sweat (moderate-intensity exercise).  Most adults should also do strengthening exercises at least twice a week. This is in addition to the moderate-intensity exercise.  Maintain a healthy weight  Body mass index (BMI) is a measurement that can be used to identify possible weight problems. It estimates body fat based on height and weight. Your health care provider can help determine your BMI and help you achieve or maintain a healthy weight.  For females 20 years of age and older:   A BMI below 18.5 is considered underweight.  A BMI of 18.5 to 24.9 is normal.  A BMI of 25 to 29.9 is considered overweight.  A BMI of 30 and above is considered obese.  Watch levels of cholesterol and blood lipids  You should start having your blood tested for lipids and cholesterol at 31 years of age, then have this test every 5 years.  You may need to have your cholesterol levels checked more often if:  Your lipid  or cholesterol levels are high.  You are older than 31 years of age.  You are at high risk for heart disease.  CANCER SCREENING   Lung Cancer  Lung cancer screening is recommended for adults 55-80 years old who are at high risk for lung cancer because of a history of smoking.  A yearly low-dose CT scan of the lungs is recommended for people who:  Currently smoke.  Have quit within the past 15 years.  Have at least a 30-pack-year history of smoking. A pack year is smoking an average of one pack of cigarettes a day for 1 year.  Yearly screening should continue until it has been 15 years since you quit.  Yearly screening should stop if you develop a health problem that would prevent you from having lung cancer treatment.  Breast Cancer  Practice breast self-awareness. This means understanding how your breasts normally appear and feel.  It also means doing regular breast self-exams. Let your health care provider know about any changes, no matter how small.  If you are in your 20s or 30s, you should have a clinical breast exam (CBE) by a health care provider every 1-3 years as part of a regular health exam.  If you are 40 or older, have a CBE every year. Also consider having a breast X-ray (mammogram) every year.  If you have a family history of breast cancer, talk to your health care provider about genetic screening.  If you   are at high risk for breast cancer, talk to your health care provider about having an MRI and a mammogram every year.  Breast cancer gene (BRCA) assessment is recommended for women who have family members with BRCA-related cancers. BRCA-related cancers include:  Breast.  Ovarian.  Tubal.  Peritoneal cancers.  Results of the assessment will determine the need for genetic counseling and BRCA1 and BRCA2 testing. Cervical Cancer Your health care provider may recommend that you be screened regularly for cancer of the pelvic organs (ovaries, uterus, and  vagina). This screening involves a pelvic examination, including checking for microscopic changes to the surface of your cervix (Pap test). You may be encouraged to have this screening done every 3 years, beginning at age 21.  For women ages 30-65, health care providers may recommend pelvic exams and Pap testing every 3 years, or they may recommend the Pap and pelvic exam, combined with testing for human papilloma virus (HPV), every 5 years. Some types of HPV increase your risk of cervical cancer. Testing for HPV may also be done on women of any age with unclear Pap test results.  Other health care providers may not recommend any screening for nonpregnant women who are considered low risk for pelvic cancer and who do not have symptoms. Ask your health care provider if a screening pelvic exam is right for you.  If you have had past treatment for cervical cancer or a condition that could lead to cancer, you need Pap tests and screening for cancer for at least 20 years after your treatment. If Pap tests have been discontinued, your risk factors (such as having a new sexual partner) need to be reassessed to determine if screening should resume. Some women have medical problems that increase the chance of getting cervical cancer. In these cases, your health care provider may recommend more frequent screening and Pap tests. Colorectal Cancer  This type of cancer can be detected and often prevented.  Routine colorectal cancer screening usually begins at 31 years of age and continues through 31 years of age.  Your health care provider may recommend screening at an earlier age if you have risk factors for colon cancer.  Your health care provider may also recommend using home test kits to check for hidden blood in the stool.  A small camera at the end of a tube can be used to examine your colon directly (sigmoidoscopy or colonoscopy). This is done to check for the earliest forms of colorectal  cancer.  Routine screening usually begins at age 50.  Direct examination of the colon should be repeated every 5-10 years through 31 years of age. However, you may need to be screened more often if early forms of precancerous polyps or small growths are found. Skin Cancer  Check your skin from head to toe regularly.  Tell your health care provider about any new moles or changes in moles, especially if there is a change in a mole's shape or color.  Also tell your health care provider if you have a mole that is larger than the size of a pencil eraser.  Always use sunscreen. Apply sunscreen liberally and repeatedly throughout the day.  Protect yourself by wearing long sleeves, pants, a wide-brimmed hat, and sunglasses whenever you are outside. HEART DISEASE, DIABETES, AND HIGH BLOOD PRESSURE   High blood pressure causes heart disease and increases the risk of stroke. High blood pressure is more likely to develop in:  People who have blood pressure in the high end   of the normal range (130-139/85-89 mm Hg).  People who are overweight or obese.  People who are African American.  If you are 38-23 years of age, have your blood pressure checked every 3-5 years. If you are 61 years of age or older, have your blood pressure checked every year. You should have your blood pressure measured twice--once when you are at a hospital or clinic, and once when you are not at a hospital or clinic. Record the average of the two measurements. To check your blood pressure when you are not at a hospital or clinic, you can use:  An automated blood pressure machine at a pharmacy.  A home blood pressure monitor.  If you are between 45 years and 39 years old, ask your health care provider if you should take aspirin to prevent strokes.  Have regular diabetes screenings. This involves taking a blood sample to check your fasting blood sugar level.  If you are at a normal weight and have a low risk for diabetes,  have this test once every three years after 31 years of age.  If you are overweight and have a high risk for diabetes, consider being tested at a younger age or more often. PREVENTING INFECTION  Hepatitis B  If you have a higher risk for hepatitis B, you should be screened for this virus. You are considered at high risk for hepatitis B if:  You were born in a country where hepatitis B is common. Ask your health care provider which countries are considered high risk.  Your parents were born in a high-risk country, and you have not been immunized against hepatitis B (hepatitis B vaccine).  You have HIV or AIDS.  You use needles to inject street drugs.  You live with someone who has hepatitis B.  You have had sex with someone who has hepatitis B.  You get hemodialysis treatment.  You take certain medicines for conditions, including cancer, organ transplantation, and autoimmune conditions. Hepatitis C  Blood testing is recommended for:  Everyone born from [birth year] through 1967.  Anyone with known risk factors for hepatitis C. Sexually transmitted infections (STIs)  You should be screened for sexually transmitted infections (STIs) including gonorrhea and chlamydia if:  You are sexually active and are younger than 31 years of age.  You are older than 31 years of age and your health care provider tells you that you are at risk for this type of infection.  Your sexual activity has changed since you were last screened and you are at an increased risk for chlamydia or gonorrhea. Ask your health care provider if you are at risk.  If you do not have HIV, but are at risk, it may be recommended that you take a prescription medicine daily to prevent HIV infection. This is called pre-exposure prophylaxis (PrEP). You are considered at risk if:  You are sexually active and do not regularly use condoms or know the HIV status of your partner(s).  You take drugs by injection.  You are sexually  active with a partner who has HIV. Talk with your health care provider about whether you are at high risk of being infected with HIV. If you choose to begin PrEP, you should first be tested for HIV. You should then be tested every 3 months for as long as you are taking PrEP.  PREGNANCY   If you are premenopausal and you may become pregnant, ask your health care provider about preconception counseling.  If you may  become pregnant, take 400 to 800 micrograms (mcg) of folic acid every day.  If you want to prevent pregnancy, talk to your health care provider about birth control (contraception). OSTEOPOROSIS AND MENOPAUSE   Osteoporosis is a disease in which the bones lose minerals and strength with aging. This can result in serious bone fractures. Your risk for osteoporosis can be identified using a bone density scan.  If you are 65 years of age or older, or if you are at risk for osteoporosis and fractures, ask your health care provider if you should be screened.  Ask your health care provider whether you should take a calcium or vitamin D supplement to lower your risk for osteoporosis.  Menopause may have certain physical symptoms and risks.  Hormone replacement therapy may reduce some of these symptoms and risks. Talk to your health care provider about whether hormone replacement therapy is right for you.  HOME CARE INSTRUCTIONS   Schedule regular health, dental, and eye exams.  Stay current with your immunizations.   Do not use any tobacco products including cigarettes, chewing tobacco, or electronic cigarettes.  If you are pregnant, do not drink alcohol.  If you are breastfeeding, limit how much and how often you drink alcohol.  Limit alcohol intake to no more than 1 drink per day for nonpregnant women. One drink equals 12 ounces of beer, 5 ounces of wine, or 1 ounces of hard liquor.  Do not use street drugs.  Do not share needles.  Ask your health care provider for help if  you need support or information about quitting drugs.  Tell your health care provider if you often feel depressed.  Tell your health care provider if you have ever been abused or do not feel safe at home.   This information is not intended to replace advice given to you by your health care provider. Make sure you discuss any questions you have with your health care provider.   Document Released: 10/14/2010 Document Revised: 04/21/2014 Document Reviewed: 03/02/2013 Elsevier Interactive Patient Education 2016 [employer] Diabetes Mellitus and Food It is important for you to manage your blood sugar (glucose) level. Your blood glucose level can be greatly affected by what you eat. Eating healthier foods in the appropriate amounts throughout the day at about the same time each day will help you control your blood glucose level. It can also help slow or prevent worsening of your diabetes mellitus. Healthy eating may even help you improve the level of your blood pressure and reach or maintain a healthy weight.  General recommendations for healthful eating and cooking habits include:  Eating meals and snacks regularly. Avoid going long periods of time without eating to lose weight.  Eating a diet that consists mainly of plant-based foods, such as fruits, vegetables, nuts, legumes, and whole grains.  Using low-heat cooking methods, such as baking, instead of high-heat cooking methods, such as deep frying. Work with your dietitian to make sure you understand how to use the Nutrition Facts information on food labels. HOW CAN FOOD AFFECT ME? Carbohydrates Carbohydrates affect your blood glucose level more than any other type of food. Your dietitian will help you determine how many carbohydrates to eat at each meal and teach you how to count carbohydrates. Counting carbohydrates is important to keep your blood glucose at a healthy level, especially if you are using insulin or taking certain medicines for  diabetes mellitus. Alcohol Alcohol can cause sudden decreases in blood glucose (hypoglycemia), especially if you   use insulin or take certain medicines for diabetes mellitus. Hypoglycemia can be a life-threatening condition. Symptoms of hypoglycemia (sleepiness, dizziness, and disorientation) are similar to symptoms of having too much alcohol.  If your health care provider has given you approval to drink alcohol, do so in moderation and use the following guidelines:  Women should not have more than one drink per day, and men should not have more than two drinks per day. One drink is equal to:  12 oz of beer.  5 oz of wine.  1 oz of hard liquor.  Do not drink on an empty stomach.  Keep yourself hydrated. Have water, diet soda, or unsweetened iced tea.  Regular soda, juice, and other mixers might contain a lot of carbohydrates and should be counted. WHAT FOODS ARE NOT RECOMMENDED? As you make food choices, it is important to remember that all foods are not the same. Some foods have fewer nutrients per serving than other foods, even though they might have the same number of calories or carbohydrates. It is difficult to get your body what it needs when you eat foods with fewer nutrients. Examples of foods that you should avoid that are high in calories and carbohydrates but low in nutrients include:  Trans fats (most processed foods list trans fats on the Nutrition Facts label).  Regular soda.  Juice.  Candy.  Sweets, such as cake, pie, doughnuts, and cookies.  Fried foods. WHAT FOODS CAN I EAT? Eat nutrient-rich foods, which will nourish your body and keep you healthy. The food you should eat also will depend on several factors, including:  The calories you need.  The medicines you take.  Your weight.  Your blood glucose level.  Your blood pressure level.  Your cholesterol level. You should eat a variety of foods, including:  Protein.  Lean cuts of meat.  Proteins low  in saturated fats, such as fish, egg whites, and beans. Avoid processed meats.  Fruits and vegetables.  Fruits and vegetables that may help control blood glucose levels, such as apples, mangoes, and yams.  Dairy products.  Choose fat-free or low-fat dairy products, such as milk, yogurt, and cheese.  Grains, bread, pasta, and rice.  Choose whole grain products, such as multigrain bread, whole oats, and brown rice. These foods may help control blood pressure.  Fats.  Foods containing healthful fats, such as nuts, avocado, olive oil, canola oil, and fish. DOES EVERYONE WITH DIABETES MELLITUS HAVE THE SAME MEAL PLAN? Because every person with diabetes mellitus is different, there is not one meal plan that works for everyone. It is very important that you meet with a dietitian who will help you create a meal plan that is just right for you.   This information is not intended to replace advice given to you by your health care provider. Make sure you discuss any questions you have with your health care provider.   Document Released: 12/26/2004 Document Revised: 04/21/2014 Document Reviewed: 02/25/2013 Elsevier Interactive Patient Education 2016 Elsevier Inc[employer]  

## 2015-03-16 LAB — URINALYSIS W MICROSCOPIC + REFLEX CULTURE
Bilirubin Urine: NEGATIVE
CASTS: NONE SEEN [LPF]
Glucose, UA: NEGATIVE
HGB URINE DIPSTICK: NEGATIVE
KETONES UR: NEGATIVE
Leukocytes, UA: NEGATIVE
NITRITE: NEGATIVE
PH: 6 (ref 5.0–8.0)
PROTEIN: NEGATIVE
RBC / HPF: NONE SEEN RBC/HPF (ref <=2)
Specific Gravity, Urine: 1.022 (ref 1.001–1.035)
Yeast: NONE SEEN [HPF]

## 2015-03-16 LAB — TSH: TSH: 1.873 u[IU]/mL (ref 0.350–4.500)

## 2015-03-17 LAB — URINE CULTURE: Colony Count: 30000

## 2015-03-19 LAB — CYTOLOGY - PAP

## 2015-04-23 ENCOUNTER — Emergency Department (INDEPENDENT_AMBULATORY_CARE_PROVIDER_SITE_OTHER)
Admission: EM | Admit: 2015-04-23 | Discharge: 2015-04-23 | Disposition: A | Discharge status: Home / Self Care | Payer: Managed Care, Other (non HMO) | Source: Home / Self Care | Attending: Family Medicine | Admitting: Family Medicine

## 2015-04-23 ENCOUNTER — Encounter (HOSPITAL_COMMUNITY): Payer: Self-pay | Admitting: Emergency Medicine

## 2015-04-23 DIAGNOSIS — M6580 Other synovitis and tenosynovitis, unspecified site: Secondary | ICD-10-CM

## 2015-04-23 DIAGNOSIS — M76891 Other specified enthesopathies of right lower limb, excluding foot: Secondary | ICD-10-CM

## 2015-04-23 NOTE — ED Provider Notes (Signed)
CSN: [id number]     Arrival date & time 04/23/15  1758 History   First MD Initiated Contact with Patient 04/23/15 1832     Chief Complaint  Patient presents with  . Knee Pain   (Consider location/radiation/quality/duration/timing/severity/associated sxs/prior Treatment) HPI Comments: 30 year old female complaining of right knee pain. She feels a popping sometimes upon extension. She states that she has aching in the knees when it is cold. The pain started 2-3 days ago. Her job requires her to sit for a while then stand but she does not have to do this repetitively. She initially stated she has had no known injury. Later she recalled a slight twisting of the right knee a month ago but did not have pain at the time.   Past Medical History  Diagnosis Date  . Bursitis of hand     right hand - also tendonitis  . 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 Abrasion of arm, right 12/19/2011  . Ganglion of left hand    Past Surgical History  Procedure Laterality Date  . Carpal tunnel release  12/22/2011    Procedure: CARPAL TUNNEL RELEASE;  Surgeon: Tami RibasKevin R Kuzma, MD;  Location: [facility];  Service: Orthopedics;  Laterality: Right;  . Mass excision  02/12/2012    Procedure: EXCISION MASS;  Surgeon: Tami RibasKevin R Kuzma, MD;  Location: [facility];  Service: Orthopedics;  Laterality: Left;  LEFT HAND EXCISION MASS   Family History  Problem Relation Age of Onset  . Lupus Mother   . Kidney failure Mother   . Stroke Other   . Rheum arthritis Other   . Heart murmur Father    Social History  Substance Use Topics  . Smoking status: Never Smoker   . Smokeless tobacco: None  . Alcohol Use: Yes     Comment: occasionally   OB History    Gravida Para Term Preterm AB TAB SAB Ectopic Multiple Living   1    1     3      Review of Systems  Constitutional: Negative for fever, chills and activity change.  HENT: Negative.   Respiratory: Negative.   Cardiovascular:  Negative.   Musculoskeletal: Positive for arthralgias.       As per HPI  Skin: Negative for color change, pallor and rash.  Neurological: Negative.     Allergies  Review of patient's allergies indicates no known allergies.  Home Medications   Prior to Admission medications   Medication Sig Start Date End Date Taking? Authorizing Provider  ibuprofen (ADVIL,MOTRIN) 200 MG tablet Take 200 mg by mouth every 6 (six) hours as needed.   Yes Historical Provider, MD  HYDROcodone-acetaminophen [facility](NORCO) 5-325 MG per tablet 1-2 tabs po q6 hours prn pain Patient not taking: Reported on 03/15/2015 02/12/12   Betha LoaKevin Kuzma, MD  medroxyPROGESTERone (DEPO-PROVERA) 150 MG/ML injection Inject 1 mL (150 mg total) into the muscle every 3 (three) months. Birth control 03/15/15   Harrington ChallengerNancy J Young, NP  Multiple Vitamins-Minerals (MULTIVITAMINS THER. W/MINERALS) TABS Take 1 tablet by mouth daily.      Historical Provider, MD  omeprazole (PRILOSEC) 20 MG capsule Take 1 capsule (20 mg total) by mouth 2 (two) times daily. 01/17/14   Ria ClockJennifer Lee H Presson, PA   Meds Ordered and Administered this Visit  Medications - No data to display  BP 128/68 mmHg  Pulse 74  Temp(Src) 98.5 F (36.9 C) (Oral)  Resp 16  SpO2 98% No data found.  Physical Exam  Constitutional: She is oriented to person, place, and time. She appears well-developed and well-nourished. No distress.  Neck: Normal range of motion. Neck supple.  Pulmonary/Chest: Effort normal. No respiratory distress.  Musculoskeletal:  Right knee exam reveals no swelling, deformity or discoloration. Strong extension to 180. Flexion to less than 90. Patient points to the bony prominences to the lateral and medial aspect of the femoral and tibial condyles a source of pain as well as the anterior knee/patella and tibial tuberosity. There is tenderness to these areas. No apparent swelling. Joint spaces without apparent effusion but with minor tenderness. No laxity, neg  drawer.  Neurological: She is alert and oriented to person, place, and time. She exhibits normal muscle tone.  Skin: Skin is warm and dry.  Psychiatric: She has a normal mood and affect.  Nursing note and vitals reviewed.   ED Course  Procedures (including critical care time)  Labs Review Labs Reviewed - No data to display  Imaging Review No results found.   Visual Acuity Review  Right Eye Distance:   Left Eye Distance:   Bilateral Distance:    Right Eye Near:   Left Eye Near:    Bilateral Near:         MDM   1. Tendinitis of right knee    Ice, stretches, rehab as demo'd. If worse see orthopedist or PCP   Hayden Rasmussen, NP 04/23/15 1850  Hayden Rasmussen, NP 04/23/15 1851

## 2015-04-23 NOTE — Discharge Instructions (Signed)
Tendinitis and Tenosynovitis  Ice, stretches as demonstrated.  Aleve or ibuprofen. If not improved see your doctor.  Tendinitis is inflammation of the tendon. Tenosynovitis is inflammation of the lining around the tendon (tendon sheath). These painful conditions often occur at once. Tendons attach muscle to bone. To move a limb, force from the muscle moves through the tendon, to the bone. These conditions often cause increased pain when moving. Tendinitis may be caused by a small or partial tear in the tendon.  SYMPTOMS   Pain, tenderness, redness, bruising, or swelling at the injury.  Loss of normal joint movement.  Pain that gets worse with use of the muscle and joint attached to the tendon.  Weakness in the tendon, caused by calcium build up that may occur with tendinitis.  Commonly affected tendons:  Achilles tendon (calf of leg).  Rotator cuff (shoulder joint).  Patellar tendon (kneecap to shin).  Peroneal tendon (ankle).  Posterior tibial tendon (inner ankle).  Biceps tendon (in front of shoulder). CAUSES   Sudden strain on a flexed muscle, muscle overuse, sudden increase or change in activity, vigorous activity.  Result of a direct hit (less common).  Poor muscle action (biomechanics). RISK INCREASES WITH:  Injury (trauma).  Too much exercise.  Sudden change in athletic activity.  Incorrect exercise form or technique.  Poor strength and flexibility.  Not warming-up properly before activity.  Returning to activity before healing is complete. PREVENTION   Warm-up and stretch properly before activity.  Maintain physical fitness:  Joint flexibility.  Muscle strength and endurance.  Fitness that increases heart rate.  Learn and use proper exercise techniques.  Use rehabilitation exercises to strengthen weak muscles and tendons.  Ice the tendon after activity, to reduce recurring inflammation.  Wear proper fitting protective equipment for specific  tendons, when indicated. PROGNOSIS  When treated properly, can be cured in 6 to 8 weeks. Recovery may take longer, depending on degree of injury.  RELATED COMPLICATIONS   Re-injury or recurring symptoms.  Permanent weakness or joint stiffness, if injury is severe and recovery is not completed.  Delayed healing, if sports are started before healing is complete.  Tearing apart (rupture) of the inflamed tendon. Tendinitis means the tendon is injured and must recover. TREATMENT  Treatment first involves ice, medicine, and rest from aggravating activities. This reduces pain and inflammation. Modifying your activity may be considered to prevent recurring injury. A brace, elastic bandage wrap, splint, cast, or sling may be prescribed to protect the joint for a short period. After that period, strengthening and stretching exercise may help to regain strength and full range of motion. If the condition persists, despite non-surgical treatment, surgery may be recommended to remove the inflamed tendon lining. Corticosteroid injections may be given to reduce inflammation. However, these injections may weaken the tendon and increase your risk for tendon rupture. MEDICATION   If pain medicine is needed, nonsteroidal anti-inflammatory medicines (aspirin and ibuprofen), or other minor pain relievers (acetaminophen), are often recommended.  Do not take pain medicine for 7 days before surgery.  Prescription pain relievers are usually prescribed only after surgery. Use only as directed and only as much as you need.  Ointments applied to the skin may be helpful.  Corticosteroid injections may be given to reduce inflammation. However, this may increase your risk of a tendon rupture. HEAT AND COLD  Cold treatment (icing) relieves pain and reduces inflammation. Cold treatment should be applied for 10 to 15 minutes every 2 to 3 hours, and immediately  after activity that aggravates your symptoms. Use ice packs or  an ice massage.  Heat treatment may be used before performing stretching and strengthening activities prescribed by your caregiver, physical therapist, or athletic trainer. Use a heat pack or a warm water soak. SEEK MEDICAL CARE IF:   Symptoms get worse or do not improve, despite treatment.  Pain becomes too much to tolerate.  You develop numbness or tingling.  Toes become cold, or toenails become blue, gray, or dark colored.  New, unexplained symptoms develop. (Drugs used in treatment may produce side effects.)   This information is not intended to replace advice given to you by your health care provider. Make sure you discuss any questions you have with your health care provider.   Document Released: 03/31/2005 Document Revised: 06/23/2011 Document Reviewed: 07/13/2008 Elsevier Interactive Patient Education [employer]

## 2015-04-23 NOTE — ED Notes (Signed)
Patient reports knee pain for 2 weeks.  No known injury at that time.  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  Patient also reports 2 days ago footing slipped and caught herself with the right side, now aching continuous, particularly the center of knee.

## 2015-05-28 ENCOUNTER — Ambulatory Visit (INDEPENDENT_AMBULATORY_CARE_PROVIDER_SITE_OTHER): Payer: Managed Care, Other (non HMO) | Admitting: *Deleted

## 2015-05-28 ENCOUNTER — Other Ambulatory Visit: Payer: Managed Care, Other (non HMO)

## 2015-05-28 DIAGNOSIS — Z3042 Encounter for surveillance of injectable contraceptive: Secondary | ICD-10-CM | POA: Diagnosis not present

## 2015-05-28 DIAGNOSIS — N912 Amenorrhea, unspecified: Secondary | ICD-10-CM

## 2015-05-28 LAB — PREGNANCY, URINE: PREG TEST UR: NEGATIVE

## 2015-05-28 MED ORDER — MEDROXYPROGESTERONE ACETATE 150 MG/ML IM SUSP
150.0000 mg | Freq: Once | INTRAMUSCULAR | Status: AC
  Administered 2015-05-28: 150 mg via INTRAMUSCULAR

## 2015-05-28 NOTE — Addendum Note (Signed)
Addended by: Kem Parkinson on: 05/28/2015 02:17 PM   Modules accepted: Orders

## 2015-08-13 ENCOUNTER — Ambulatory Visit (INDEPENDENT_AMBULATORY_CARE_PROVIDER_SITE_OTHER): Payer: Managed Care, Other (non HMO) | Admitting: *Deleted

## 2015-08-13 DIAGNOSIS — Z3042 Encounter for surveillance of injectable contraceptive: Secondary | ICD-10-CM | POA: Diagnosis not present

## 2015-08-13 MED ORDER — MEDROXYPROGESTERONE ACETATE 150 MG/ML IM SUSP
150.0000 mg | Freq: Once | INTRAMUSCULAR | Status: AC
  Administered 2015-08-13: 150 mg via INTRAMUSCULAR

## 2015-08-18 ENCOUNTER — Emergency Department (HOSPITAL_COMMUNITY): Payer: Managed Care, Other (non HMO)

## 2015-08-18 ENCOUNTER — Emergency Department (HOSPITAL_COMMUNITY)
Admission: EM | Admit: 2015-08-18 | Discharge: 2015-08-18 | Disposition: A | Discharge status: Home / Self Care | Payer: Managed Care, Other (non HMO) | Attending: Emergency Medicine | Admitting: Emergency Medicine

## 2015-08-18 ENCOUNTER — Encounter (HOSPITAL_COMMUNITY): Payer: Self-pay

## 2015-08-18 DIAGNOSIS — Z793 Long term (current) use of hormonal contraceptives: Secondary | ICD-10-CM | POA: Insufficient documentation

## 2015-08-18 DIAGNOSIS — H748X1 Other specified disorders of right middle ear and mastoid: Secondary | ICD-10-CM | POA: Diagnosis not present

## 2015-08-18 DIAGNOSIS — Z8739 Personal history of other diseases of the musculoskeletal system and connective tissue: Secondary | ICD-10-CM | POA: Diagnosis not present

## 2015-08-18 DIAGNOSIS — Z87828 Personal history of other (healed) physical injury and trauma: Secondary | ICD-10-CM | POA: Diagnosis not present

## 2015-08-18 DIAGNOSIS — Z79899 Other long term (current) drug therapy: Secondary | ICD-10-CM | POA: Diagnosis not present

## 2015-08-18 DIAGNOSIS — J0100 Acute maxillary sinusitis, unspecified: Secondary | ICD-10-CM | POA: Insufficient documentation

## 2015-08-18 DIAGNOSIS — R05 Cough: Secondary | ICD-10-CM

## 2015-08-18 DIAGNOSIS — R1110 Vomiting, unspecified: Secondary | ICD-10-CM | POA: Insufficient documentation

## 2015-08-18 DIAGNOSIS — R059 Cough, unspecified: Secondary | ICD-10-CM

## 2015-08-18 MED ORDER — BENZONATATE 100 MG PO CAPS: 200.0000 mg | ORAL_CAPSULE | Freq: Two times a day (BID) | ORAL | Status: DC | PRN

## 2015-08-18 MED ORDER — AMOXICILLIN-POT CLAVULANATE 875-125 MG PO TABS: 1.0000 | ORAL_TABLET | Freq: Two times a day (BID) | ORAL | Status: DC

## 2015-08-18 MED ORDER — OXYMETAZOLINE HCL 0.05 % NA SOLN: 1.0000 | Freq: Two times a day (BID) | NASAL | Status: DC

## 2015-08-18 NOTE — ED Notes (Signed)
Declined W/C at D/C and was escorted to lobby by RN. 

## 2015-08-18 NOTE — ED Provider Notes (Signed)
CSN: [id number]     Arrival date & time 08/18/15  1040 History  By signing my name below, I, Soijett Blue, attest that this documentation has been prepared under the direction and in the presence of Melburn Hake, PA-C Electronically Signed: Soijett Blue, ED Scribe. 08/18/2015. 11:50 AM.   Chief Complaint  Patient presents with  . Cough  . Nasal Congestion      The history is provided by the patient. No language interpreter was used.    Brandy Tyler is a 30 y.o. female who presents to the Emergency Department complaining of dry hacking cough onset 3 weeks. Pt reports that he went to see her PCP for her symptoms on the first week and was informed to use symptomatic treatment. Pt notes that now she hears rattling when she coughs. Pt reports that her cough has became more congestive over the past week. She states that she is having associated symptoms of nasal congestion, rhinorrhea, post-tussive vomiting, sinus pressure x this morning, muffled hearing, sinus pressure, chest congestion, wheezing, and SOB worsened at night. She states that she has tried OTC cold medications, left over Rx 0.5 amoxicillin for 3 days, and her daughters nebulizer treatment with no relief for her symptoms. She denies fever, watery eyes, eye drainage, CP, and any other symptoms. Denies hx of seasonal allergies. Denies smoking cigarettes. Denies PMHx or taking daily medications.    Past Medical History  Diagnosis Date  . Bursitis of hand     right hand - also tendonitis  . 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 Abrasion of arm, right 12/19/2011  . Ganglion of left hand    Past Surgical History  Procedure Laterality Date  . Carpal tunnel release  12/22/2011    Procedure: CARPAL TUNNEL RELEASE;  Surgeon: Tami Ribas, MD;  Location: [facility];  Service: Orthopedics;  Laterality: Right;  . Mass excision  02/12/2012    Procedure: EXCISION MASS;  Surgeon: Tami Ribas, MD;  Location: [facility];  Service: Orthopedics;  Laterality: Left;  LEFT HAND EXCISION MASS   Family History  Problem Relation Age of Onset  . Lupus Mother   . Kidney failure Mother   . Stroke Other   . Rheum arthritis Other   . Heart murmur Father    Social History  Substance Use Topics  . Smoking status: Never Smoker   . Smokeless tobacco: None  . Alcohol Use: Yes     Comment: occasionally   OB History    Gravida Para Term Preterm AB TAB SAB Ectopic Multiple Living   1    1     3      Review of Systems  Constitutional: Negative for fever.  HENT: Positive for congestion, rhinorrhea and sinus pressure.        Muffled hearing bilaterally  Eyes: Negative for discharge.       No watery eyes  Respiratory: Positive for cough, shortness of breath and wheezing.   Cardiovascular: Negative for chest pain.  Gastrointestinal: Positive for vomiting.      Allergies  Review of patient's allergies indicates no known allergies.  Home Medications   Prior to Admission medications   Medication Sig Start Date End Date Taking? Authorizing Provider  amoxicillin-clavulanate (AUGMENTIN) 875-125 MG tablet Take 1 tablet by mouth every 12 (twelve) hours. 08/18/15   Barrett Henle, PA-C  benzonatate (TESSALON) 100 MG capsule Take 2 capsules (200 mg total) by mouth 2 (two) times  daily as needed for cough. 08/18/15   Barrett HenleNicole Elizabeth Nadeau, PA-C  HYDROcodone-acetaminophen [facility](NORCO) 5-325 MG per tablet 1-2 tabs po q6 hours prn pain Patient not taking: Reported on 03/15/2015 02/12/12   Betha LoaKevin Kuzma, MD  ibuprofen (ADVIL,MOTRIN) 200 MG tablet Take 200 mg by mouth every 6 (six) hours as needed.    Historical Provider, MD  medroxyPROGESTERone (DEPO-PROVERA) 150 MG/ML injection Inject 1 mL (150 mg total) into the muscle every 3 (three) months. Birth control 03/15/15   Harrington ChallengerNancy J Young, NP  Multiple Vitamins-Minerals (MULTIVITAMINS THER. W/MINERALS) TABS Take 1 tablet by mouth daily.      Historical Provider, MD   omeprazole (PRILOSEC) 20 MG capsule Take 1 capsule (20 mg total) by mouth 2 (two) times daily. 01/17/14   Ria ClockJennifer Lee H Presson, PA  oxymetazoline (AFRIN NASAL SPRAY) 0.05 % nasal spray Place 1 spray into both nostrils 2 (two) times daily. 08/18/15   Satira SarkNicole Elizabeth Nadeau, PA-C   BP 130/87 mmHg  Pulse 88  Temp(Src) 98.6 F (37 C) (Oral)  Resp 20  Wt 63.322 kg  SpO2 98% Physical Exam  Constitutional: She is oriented to person, place, and time. She appears well-developed and well-nourished. No distress.  HENT:  Head: Normocephalic and atraumatic.  Right Ear: Tympanic membrane is not injected and not erythematous. A middle ear effusion is present.  Left Ear: Tympanic membrane normal.  Nose: Rhinorrhea present. Right sinus exhibits maxillary sinus tenderness. Right sinus exhibits no frontal sinus tenderness. Left sinus exhibits maxillary sinus tenderness. Left sinus exhibits no frontal sinus tenderness.  Mouth/Throat: Uvula is midline, oropharynx is clear and moist and mucous membranes are normal. No oropharyngeal exudate, posterior oropharyngeal edema, posterior oropharyngeal erythema or tonsillar abscesses.  Eyes: Conjunctivae and EOM are normal. Pupils are equal, round, and reactive to light.  Neck: Normal range of motion. Neck supple.  Cardiovascular: Normal rate, regular rhythm and normal heart sounds.  Exam reveals no gallop and no friction rub.   No murmur heard. Pulmonary/Chest: Effort normal and breath sounds normal. No respiratory distress. She has no wheezes. She has no rales.  Abdominal: She exhibits no distension.  Musculoskeletal: Normal range of motion.  Lymphadenopathy:    She has no cervical adenopathy.  Neurological: She is alert and oriented to person, place, and time.  Skin: Skin is warm and dry.  Psychiatric: She has a normal mood and affect. Her behavior is normal.  Nursing note and vitals reviewed.   ED Course  Procedures (including critical care  time) DIAGNOSTIC STUDIES: Oxygen Saturation is 98% on RA, nl by my interpretation.    COORDINATION OF CARE: 11:50 AM Discussed treatment plan with pt at bedside which includes CXR, Rx for afrin, abx Rx, and follow up with PCP in 3-4 days and pt agreed to plan.    Labs Review Labs Reviewed - No data to display  Imaging Review Dg Chest 2 View  08/18/2015  CLINICAL DATA:  Cough for 3 weeks.  Chest tightness EXAM: CHEST  2 VIEW COMPARISON:  Chest radiograph March 20, 2011; chest CT March 20, 2011 FINDINGS: Lungs are clear. Heart size and pulmonary vascularity are normal. No adenopathy. No pneumothorax. No bone lesions. IMPRESSION: No abnormality noted. Electronically Signed   By: Bretta BangWilliam  Woodruff III M.D.   On: 08/18/2015 11:43   I have personally reviewed and evaluated these images as part of my medical decision-making.   EKG Interpretation None      MDM   Final diagnoses:  Acute maxillary sinusitis, recurrence  not specified  Cough     Pt symptoms consistent with URI. CXR negative for acute infiltrate. Pt will be discharged with Afrin and tessalon Rx and symptomatic treatment.   Patient complaining of symptoms of sinusitis. Moderate symptoms have been present for greater than 10 days with purulent nasal discharge and maxillary sinus pain.  Concern for acute bacterial rhinosinusitis.  Patient discharged with Augmentin.   Instructions given for warm saline nasal wash.  Pt advised to follow up with PCP in 3-4 days following abx treatment if symptoms worsen. Discussed return precautions.  Pt is hemodynamically stable & in NAD prior to discharge.   I personally performed the services described in this documentation, which was scribed in my presence. The recorded information has been reviewed and is accurate.    Satira Sark [place], [place] 08/18/15 1643  Zadie Rhine, MD 08/19/15 [phone]

## 2015-08-18 NOTE — Discharge Instructions (Signed)
Take your medications as prescribed. I also recommend drinking at least six 8 ounce glasses of water daily to remain hydrated at home. You may drink warm water or tea with honey to help with your congestion and cough. Follow-up with your primary care provider in the next 4-5 days if your symptoms have not improved. Return to the emergency department if symptoms worsen or new onset of fever, worsening headache, visual changes, lightheadedness, dizziness, coughing up blood, chest pain, difficulty breathing, neck stiffness, vomiting.

## 2015-08-18 NOTE — ED Notes (Signed)
Patient here with dry hacky cough x 3 weeks with congestion and runny nose, no distress

## 2015-10-29 ENCOUNTER — Ambulatory Visit (INDEPENDENT_AMBULATORY_CARE_PROVIDER_SITE_OTHER): Payer: Managed Care, Other (non HMO) | Admitting: *Deleted

## 2015-10-29 DIAGNOSIS — Z3042 Encounter for surveillance of injectable contraceptive: Secondary | ICD-10-CM

## 2015-10-29 MED ORDER — MEDROXYPROGESTERONE ACETATE 150 MG/ML IM SUSP
150.0000 mg | Freq: Once | INTRAMUSCULAR | Status: AC
  Administered 2015-10-29: 150 mg via INTRAMUSCULAR

## 2016-01-14 ENCOUNTER — Ambulatory Visit: Payer: Managed Care, Other (non HMO)

## 2016-01-15 ENCOUNTER — Ambulatory Visit: Payer: Managed Care, Other (non HMO)

## 2016-01-17 ENCOUNTER — Telehealth: Payer: Self-pay

## 2016-01-17 DIAGNOSIS — Z3042 Encounter for surveillance of injectable contraceptive: Secondary | ICD-10-CM

## 2016-01-17 MED ORDER — MEDROXYPROGESTERONE ACETATE 150 MG/ML IM SUSP: 150.0000 mg | INTRAMUSCULAR | 0 refills | Status: DC

## 2016-01-17 NOTE — Telephone Encounter (Signed)
Patient wants her Dept Provera sent to mail order pharmacy.

## 2016-01-17 NOTE — Telephone Encounter (Signed)
I called patient. She needs her D-P inj Rx sent to [facility]. More cost efficient.  Rx sent. I reminded her there will be no refills on it because she needs her CE in Dec for new yearly Rx.

## 2016-01-17 NOTE — Telephone Encounter (Signed)
Thanks for handling  

## 2016-01-28 ENCOUNTER — Ambulatory Visit (INDEPENDENT_AMBULATORY_CARE_PROVIDER_SITE_OTHER): Payer: Managed Care, Other (non HMO) | Admitting: Anesthesiology

## 2016-01-28 DIAGNOSIS — Z3042 Encounter for surveillance of injectable contraceptive: Secondary | ICD-10-CM | POA: Diagnosis not present

## 2016-01-28 MED ORDER — MEDROXYPROGESTERONE ACETATE 150 MG/ML IM SUSP
150.0000 mg | Freq: Once | INTRAMUSCULAR | Status: AC
  Administered 2016-01-28: 150 mg via INTRAMUSCULAR

## 2016-03-18 ENCOUNTER — Encounter: Payer: Managed Care, Other (non HMO) | Admitting: Women's Health

## 2016-03-18 DIAGNOSIS — Z0289 Encounter for other administrative examinations: Secondary | ICD-10-CM

## 2016-04-15 ENCOUNTER — Telehealth: Payer: Self-pay | Admitting: *Deleted

## 2016-04-15 DIAGNOSIS — Z3042 Encounter for surveillance of injectable contraceptive: Secondary | ICD-10-CM

## 2016-04-15 MED ORDER — MEDROXYPROGESTERONE ACETATE 150 MG/ML IM SUSP: 150.0000 mg | INTRAMUSCULAR | 0 refills | Status: DC

## 2016-04-15 NOTE — Telephone Encounter (Signed)
Pt has annual on 04/16/16 needs refill on depo-provera, Rx sent.

## 2016-04-16 ENCOUNTER — Encounter: Payer: Self-pay | Admitting: Women's Health

## 2016-04-16 ENCOUNTER — Ambulatory Visit (INDEPENDENT_AMBULATORY_CARE_PROVIDER_SITE_OTHER): Payer: Managed Care, Other (non HMO) | Admitting: Women's Health

## 2016-04-16 VITALS — BP 124/80 | Ht 60.0 in | Wt 137.0 lb

## 2016-04-16 DIAGNOSIS — Z1329 Encounter for screening for other suspected endocrine disorder: Secondary | ICD-10-CM

## 2016-04-16 DIAGNOSIS — Z13220 Encounter for screening for lipoid disorders: Secondary | ICD-10-CM | POA: Diagnosis not present

## 2016-04-16 DIAGNOSIS — Z3042 Encounter for surveillance of injectable contraceptive: Secondary | ICD-10-CM

## 2016-04-16 DIAGNOSIS — Z01419 Encounter for gynecological examination (general) (routine) without abnormal findings: Secondary | ICD-10-CM

## 2016-04-16 MED ORDER — MEDROXYPROGESTERONE ACETATE 150 MG/ML IM SUSP: 150.0000 mg | INTRAMUSCULAR | 4 refills | Status: DC

## 2016-04-16 MED ORDER — MEDROXYPROGESTERONE ACETATE 150 MG/ML IM SUSP
150.0000 mg | Freq: Once | INTRAMUSCULAR | Status: AC
  Administered 2016-04-16: 150 mg via INTRAMUSCULAR

## 2016-04-16 NOTE — Addendum Note (Signed)
Addended by: Kem ParkinsonBARNES, Liona Wengert on: 04/16/2016 04:35 PM   Modules accepted: Orders

## 2016-04-16 NOTE — Progress Notes (Signed)
Brandy AmorCathy B Tyler 10/04/1985 [id number]    History:    Presents for annual exam. Amenorrheic on Depo-Provera. History of an abnormal Pap many years ago not needing treatment with normal Paps after.  Past medical history, past surgical history, family history and social history were all reviewed and documented in the EPIC chart. 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 Mother died of lupus at age 33.  ROS:  A ROS was performed and pertinent positives and negatives are included.  Exam:  Vitals:   04/16/16 0848  BP: 124/80  Weight: 137 lb (62.1 kg)  Height: 5' (1.524 m)   Body mass index is 26.76 kg/m.   General appearance:  Normal Thyroid:  Symmetrical, normal in size, without palpable masses or nodularity. Respiratory  Auscultation:  Clear without wheezing or rhonchi Cardiovascular  Auscultation:  Regular rate, without rubs, murmurs or gallops  Edema/varicosities:  Not grossly evident Abdominal  Soft,nontender, without masses, guarding or rebound.  Liver/spleen:  No organomegaly noted  Hernia:  None appreciated  Skin  Inspection:  Grossly normal   Breasts: Examined lying and sitting.     Right: Without masses, retractions, discharge or axillary adenopathy.     Left: Without masses, retractions, discharge or axillary adenopathy. Gentitourinary   Inguinal/mons:  Normal without inguinal adenopathy  External genitalia:  Normal  BUS/Urethra/Skene's glands:  Normal  Vagina:  Normal  Cervix:  Normal  Uterus:   normal in size, shape and contour.  Midline and mobile  Adnexa/parametria:     Rt: Without masses or tenderness.   Lt: Without masses or tenderness.  Anus and perineum: Normal  Digital rectal exam: Normal sphincter tone without palpated masses or tenderness  Assessment/Plan:  30 y.o.MBF G3P3  for annual exam with no complaints.  Amenorrheic on Depo-Provera  Plan: Depo-Provera 150 IM every 12 weeks, prescription, proper use given and reviewed. Reviewed  importance of weightbearing exercise, calcium rich diet, vitamin D 1000 daily encouraged. Aware of bone loss while on Depo-Provera, other options reviewed and declined. SBE's, increase regular exercise and decrease calories for weight loss encouraged. CBC, CMP, lipid panel, TSH, UA, Pap normal with negative HR HPV 2016, new screening guidelines reviewed.  Harrington ChallengerYOUNG,Brandy Tyler, 11:37 AM 04/16/2016

## 2016-04-16 NOTE — Patient Instructions (Signed)

## 2016-04-17 LAB — URINALYSIS W MICROSCOPIC + REFLEX CULTURE
BACTERIA UA: NONE SEEN [HPF]
Bilirubin Urine: NEGATIVE
CASTS: NONE SEEN [LPF]
CRYSTALS: NONE SEEN [HPF]
Glucose, UA: NEGATIVE
HGB URINE DIPSTICK: NEGATIVE
Ketones, ur: NEGATIVE
LEUKOCYTES UA: NEGATIVE
Nitrite: NEGATIVE
PROTEIN: NEGATIVE
RBC / HPF: NONE SEEN RBC/HPF (ref <=2)
Specific Gravity, Urine: 1.02 (ref 1.001–1.035)
WBC, UA: NONE SEEN WBC/HPF (ref <=5)
YEAST: NONE SEEN [HPF]
pH: 6.5 (ref 5.0–8.0)

## 2016-07-01 ENCOUNTER — Telehealth: Payer: Self-pay | Admitting: *Deleted

## 2016-07-01 DIAGNOSIS — Z3042 Encounter for surveillance of injectable contraceptive: Secondary | ICD-10-CM

## 2016-07-01 MED ORDER — MEDROXYPROGESTERONE ACETATE 150 MG/ML IM SUSP: 150.0000 mg | INTRAMUSCULAR | 4 refills | Status: DC

## 2016-07-01 NOTE — Telephone Encounter (Signed)
Pt called requesting to have her depo provera [facility], Rx sent with refills until annual 2019.

## 2016-07-02 ENCOUNTER — Ambulatory Visit (INDEPENDENT_AMBULATORY_CARE_PROVIDER_SITE_OTHER): Payer: Managed Care, Other (non HMO) | Admitting: *Deleted

## 2016-07-02 DIAGNOSIS — Z3042 Encounter for surveillance of injectable contraceptive: Secondary | ICD-10-CM

## 2016-07-02 MED ORDER — MEDROXYPROGESTERONE ACETATE 150 MG/ML IM SUSP
150.0000 mg | Freq: Once | INTRAMUSCULAR | Status: AC
  Administered 2016-07-02: 150 mg via INTRAMUSCULAR

## 2016-07-02 NOTE — Progress Notes (Signed)
Next shot due between June 6 - October 01 2016. Pt given that information. KW CMA

## 2016-09-17 ENCOUNTER — Ambulatory Visit (INDEPENDENT_AMBULATORY_CARE_PROVIDER_SITE_OTHER): Payer: Managed Care, Other (non HMO) | Admitting: Gynecology

## 2016-09-17 DIAGNOSIS — Z3042 Encounter for surveillance of injectable contraceptive: Secondary | ICD-10-CM

## 2016-09-17 MED ORDER — MEDROXYPROGESTERONE ACETATE 150 MG/ML IM SUSP
150.0000 mg | Freq: Once | INTRAMUSCULAR | Status: AC
  Administered 2016-09-17: 150 mg via INTRAMUSCULAR

## 2017-01-06 ENCOUNTER — Other Ambulatory Visit: Payer: Managed Care, Other (non HMO)

## 2017-01-06 ENCOUNTER — Ambulatory Visit (INDEPENDENT_AMBULATORY_CARE_PROVIDER_SITE_OTHER): Payer: Managed Care, Other (non HMO) | Admitting: *Deleted

## 2017-01-06 DIAGNOSIS — Z3042 Encounter for surveillance of injectable contraceptive: Secondary | ICD-10-CM

## 2017-01-06 NOTE — Progress Notes (Signed)
Pt was not happy that her medroxyprogesterone shot could not be given today. She was out of the window and had intercourse 2 days ago.  The patient was adamant that we informed her wrong on the appointment and demanded [place] give her the injection today. I explained the protocol and the reasons behind the protocol. She left without receiving her injection. At her last injection the follow up in the wrap section stated to return in 12 weeks around 12/10/16 for next injection.  [facility] CMA Practice Administrator

## 2017-01-06 NOTE — Progress Notes (Signed)
Pt came in past her due date for injection.Asked pt to leave UPT as well as abstain from intercourse for 2 weeks  Pt states she will switch doctors. Sherrilyn Rist explained the protocol to pt.

## 2017-04-20 ENCOUNTER — Encounter: Payer: Managed Care, Other (non HMO) | Admitting: Women's Health

## 2017-06-30 ENCOUNTER — Encounter: Payer: Self-pay | Admitting: Women's Health

## 2017-06-30 ENCOUNTER — Ambulatory Visit (INDEPENDENT_AMBULATORY_CARE_PROVIDER_SITE_OTHER): Payer: Managed Care, Other (non HMO) | Admitting: Women's Health

## 2017-06-30 VITALS — BP 132/80 | Ht 60.0 in | Wt 132.0 lb

## 2017-06-30 DIAGNOSIS — Z3042 Encounter for surveillance of injectable contraceptive: Secondary | ICD-10-CM

## 2017-06-30 DIAGNOSIS — Z01419 Encounter for gynecological examination (general) (routine) without abnormal findings: Secondary | ICD-10-CM

## 2017-06-30 MED ORDER — MEDROXYPROGESTERONE ACETATE 150 MG/ML IM SUSP: 150.0000 mg | INTRAMUSCULAR | 4 refills | Status: DC

## 2017-06-30 NOTE — Patient Instructions (Signed)

## 2017-06-30 NOTE — Progress Notes (Signed)
Brandy AmorCathy B Tyler 01/02/1986 [id number]    History:    Presents for annual exam.  Amenorrheic on Depo-Provera.  Indicated that her nurse friend gives her Depo-Provera shots. History of abnormal Pap many years ago not needing treatment with normal Paps after.  Reported doing cardio kick boxing 3 times a week.  Eating a healthier diet consists of no park, less beef, and more vegetable.     Past medical history, past surgical history, family history and social history were all reviewed and documented in the EPIC chart.  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  Mother died of lupus at age 34. Moved to a new house February 2019.  ROS:  A ROS was performed and pertinent positives and negatives are included.  Exam:  Vitals:   06/30/17 1050  BP: 132/80  Weight: 132 lb (59.9 kg)  Height: 5' (1.524 m)   Body mass index is 25.78 kg/m.   General appearance:  Normal Thyroid:  Symmetrical, normal in size, without palpable masses or nodularity. Respiratory  Auscultation:  Clear without wheezing or rhonchi Cardiovascular  Auscultation:  Regular rate, without rubs, murmurs or gallops  Edema/varicosities:  Not grossly evident Abdominal  Soft,nontender, without masses, guarding or rebound.  Liver/spleen:  No organomegaly noted  Hernia:  None appreciated  Skin  Inspection:  Grossly normal   Breasts: Examined lying and sitting.     Right: Without masses, retractions, discharge or axillary adenopathy.     Left: Without masses, retractions, discharge or axillary adenopathy. Gentitourinary   Inguinal/mons:  Normal without inguinal adenopathy  External genitalia:  Normal  BUS/Urethra/Skene's glands:  Normal  Vagina:  Normal  Cervix:  Normal  Uterus:  normal in size, shape and contour.  Midline and mobile  Adnexa/parametria:     Rt: Without masses or tenderness.   Lt: Without masses or tenderness.  Anus and perineum: Normal   Assessment/Plan:  32 y.o. MBF  G4P3  for annual exam with no complaints.   Amenorrheic on Depo-Provera   Plan: Depo-Provera 150 IM every 12 weeks, prescription, proper use given and reviewed.  Encouraged to continue exercise and healthy diet.  Vitamin D 1000 daily encouraged.  Aware of bone loss while on Depo-Provera, other options reviewed and declined.  CBC, CMP, Pap with HPV typing.  Pap normal was negative HR HPV 2016.  Instructed to come fasting on her next well female exam visits to have a lipid panel.    Harrington Challengerancy J Jenella Craigie [facility]anyWHNP, 11:27 AM 06/30/2017

## 2017-07-01 LAB — COMPREHENSIVE METABOLIC PANEL
AG Ratio: 1.6 (calc) (ref 1.0–2.5)
ALKALINE PHOSPHATASE (APISO): 34 U/L (ref 33–115)
ALT: 16 U/L (ref 6–29)
AST: 14 U/L (ref 10–30)
Albumin: 4.3 g/dL (ref 3.6–5.1)
BUN: 10 mg/dL (ref 7–25)
CO2: 27 mmol/L (ref 20–32)
CREATININE: 0.81 mg/dL (ref 0.50–1.10)
Calcium: 9.4 mg/dL (ref 8.6–10.2)
Chloride: 107 mmol/L (ref 98–110)
GLOBULIN: 2.7 g/dL (ref 1.9–3.7)
Glucose, Bld: 68 mg/dL (ref 65–99)
Potassium: 3.7 mmol/L (ref 3.5–5.3)
Sodium: 141 mmol/L (ref 135–146)
Total Bilirubin: 0.6 mg/dL (ref 0.2–1.2)
Total Protein: 7 g/dL (ref 6.1–8.1)

## 2017-07-01 LAB — CBC WITH DIFFERENTIAL/PLATELET
Basophils Absolute: 30 cells/uL (ref 0–200)
Basophils Relative: 0.7 %
EOS PCT: 3.5 %
Eosinophils Absolute: 151 cells/uL (ref 15–500)
HCT: 38.4 % (ref 35.0–45.0)
Hemoglobin: 13.2 g/dL (ref 11.7–15.5)
LYMPHS ABS: 1922 {cells}/uL (ref 850–3900)
MCH: 32.5 pg (ref 27.0–33.0)
MCHC: 34.4 g/dL (ref 32.0–36.0)
MCV: 94.6 fL (ref 80.0–100.0)
MPV: 10 fL (ref 7.5–12.5)
Monocytes Relative: 10.2 %
NEUTROS PCT: 40.9 %
Neutro Abs: 1759 cells/uL (ref 1500–7800)
PLATELETS: 310 10*3/uL (ref 140–400)
RBC: 4.06 10*6/uL (ref 3.80–5.10)
RDW: 12.1 % (ref 11.0–15.0)
TOTAL LYMPHOCYTE: 44.7 %
WBC mixed population: 439 cells/uL (ref 200–950)
WBC: 4.3 10*3/uL (ref 3.8–10.8)

## 2017-07-02 LAB — PAP IG W/ RFLX HPV ASCU

## 2018-09-26 ENCOUNTER — Other Ambulatory Visit: Payer: Self-pay | Admitting: Women's Health

## 2018-09-26 DIAGNOSIS — Z3042 Encounter for surveillance of injectable contraceptive: Secondary | ICD-10-CM

## 2018-09-29 ENCOUNTER — Other Ambulatory Visit: Payer: Self-pay | Admitting: Women's Health

## 2018-09-29 DIAGNOSIS — Z3042 Encounter for surveillance of injectable contraceptive: Secondary | ICD-10-CM

## 2018-10-18 ENCOUNTER — Other Ambulatory Visit: Payer: Self-pay

## 2018-10-19 ENCOUNTER — Encounter: Payer: Self-pay | Admitting: Women's Health

## 2018-10-19 ENCOUNTER — Ambulatory Visit (INDEPENDENT_AMBULATORY_CARE_PROVIDER_SITE_OTHER): Payer: 59 | Admitting: Women's Health

## 2018-10-19 VITALS — BP 122/82 | Ht 60.0 in | Wt 137.0 lb

## 2018-10-19 DIAGNOSIS — Z3042 Encounter for surveillance of injectable contraceptive: Secondary | ICD-10-CM | POA: Diagnosis not present

## 2018-10-19 DIAGNOSIS — Z13220 Encounter for screening for lipoid disorders: Secondary | ICD-10-CM | POA: Diagnosis not present

## 2018-10-19 DIAGNOSIS — Z01419 Encounter for gynecological examination (general) (routine) without abnormal findings: Secondary | ICD-10-CM

## 2018-10-19 MED ORDER — MEDROXYPROGESTERONE ACETATE 150 MG/ML IM SUSY: 150.0000 mg | PREFILLED_SYRINGE | INTRAMUSCULAR | 4 refills | Status: DC

## 2018-10-19 NOTE — Progress Notes (Signed)
Brandy Tyler [birth year] [id number]    History:    Presents for annual exam.  Amenorrheic on Depo-Provera, self administers.  History of abnormal Pap years ago with cryo and normal Paps since.  Past medical history, past surgical history, family history and social history were all reviewed and documented in the EPIC chart.  Works at [employer].  Son is 76, daughters 36 and 65 all doing well and have had Gardasil.  Mother deceased at age 35 from lupus.  ROS:  A ROS was performed and pertinent positives and negatives are included.  Exam:  Vitals:   10/19/18 0947  BP: 122/82  Weight: 137 lb (62.1 kg)  Height: 5' (1.524 m)   Body mass index is 26.76 kg/m.   General appearance:  Normal Thyroid:  Symmetrical, normal in size, without palpable masses or nodularity. Respiratory  Auscultation:  Clear without wheezing or rhonchi Cardiovascular  Auscultation:  Regular rate, without rubs, murmurs or gallops  Edema/varicosities:  Not grossly evident Abdominal  Soft,nontender, without masses, guarding or rebound.  Liver/spleen:  No organomegaly noted  Hernia:  None appreciated  Skin  Inspection:  Grossly normal   Breasts: Examined lying and sitting.     Right: Without masses, retractions, discharge or axillary adenopathy.     Left: Without masses, retractions, discharge or axillary adenopathy. Gentitourinary   Inguinal/mons:  Normal without inguinal adenopathy  External genitalia:  Normal  BUS/Urethra/Skene's glands:  Normal  Vagina:  Normal  Cervix:  Normal  Uterus:   normal in size, shape and contour.  Midline and mobile  Adnexa/parametria:     Rt: Without masses or tenderness.   Lt: Without masses or tenderness.  Anus and perineum: Normal  Digital rectal exam: Normal sphincter tone without palpated masses or tenderness  Assessment/Plan:  33 y.o. MBF G3, P3 for annual exam with no complaints.  Amenorrheic on Depo-Provera Abnormal Pap many years ago normal since  Plan:  Depo-Provera 150 mg IM every 12 weeks prescription, proper use given and reviewed proper administration.  SBEs, exercise, calcium rich foods, vitamin D 1000 daily encouraged.  Continue healthy lifestyle of regular exercise, kick boxing and healthy diet.  CBC, glucose, lipid panel, Pap with HR HPV typing, new screening guidelines reviewed.    Huel Cote [facility], 11:51 AM 10/19/2018

## 2018-10-19 NOTE — Patient Instructions (Signed)
Vit d 3 1000 iu daily  Health Maintenance, Female Adopting a healthy lifestyle and getting preventive care are important in promoting health and wellness. Ask your health care provider about:  The right schedule for you to have regular tests and exams.  Things you can do on your own to prevent diseases and keep yourself healthy. What should I know about diet, weight, and exercise? Eat a healthy diet   Eat a diet that includes plenty of vegetables, fruits, low-fat dairy products, and lean protein.  Do not eat a lot of foods that are high in solid fats, added sugars, or sodium. Maintain a healthy weight Body mass index (BMI) is used to identify weight problems. It estimates body fat based on height and weight. Your health care provider can help determine your BMI and help you achieve or maintain a healthy weight. Get regular exercise Get regular exercise. This is one of the most important things you can do for your health. Most adults should:  Exercise for at least 150 minutes each week. The exercise should increase your heart rate and make you sweat (moderate-intensity exercise).  Do strengthening exercises at least twice a week. This is in addition to the moderate-intensity exercise.  Spend less time sitting. Even light physical activity can be beneficial. Watch cholesterol and blood lipids Have your blood tested for lipids and cholesterol at 35 years of age, then have this test every 5 years. Have your cholesterol levels checked more often if:  Your lipid or cholesterol levels are high.  You are older than 35 years of age.  You are at high risk for heart disease. What should I know about cancer screening? Depending on your health history and family history, you may need to have cancer screening at various ages. This may include screening for:  Breast cancer.  Cervical cancer.  Colorectal cancer.  Skin cancer.  Lung cancer. What should I know about heart disease,  diabetes, and high blood pressure? Blood pressure and heart disease  High blood pressure causes heart disease and increases the risk of stroke. This is more likely to develop in people who have high blood pressure readings, are of African descent, or are overweight.  Have your blood pressure checked: ? Every 3-5 years if you are 63-73 years of age. ? Every year if you are 41 years old or older. Diabetes Have regular diabetes screenings. This checks your fasting blood sugar level. Have the screening done:  Once every three years after age 14 if you are at a normal weight and have a low risk for diabetes.  More often and at a younger age if you are overweight or have a high risk for diabetes. What should I know about preventing infection? Hepatitis B If you have a higher risk for hepatitis B, you should be screened for this virus. Talk with your health care provider to find out if you are at risk for hepatitis B infection. Hepatitis C Testing is recommended for:  Everyone born from [birth year] through 1965.  Anyone with known risk factors for hepatitis C. Sexually transmitted infections (STIs)  Get screened for STIs, including gonorrhea and chlamydia, if: ? You are sexually active and are younger than 35 years of age. ? You are older than 35 years of age and your health care provider tells you that you are at risk for this type of infection. ? Your sexual activity has changed since you were last screened, and you are at increased risk for chlamydia or  gonorrhea. Ask your health care provider if you are at risk.  Ask your health care provider about whether you are at high risk for HIV. Your health care provider may recommend a prescription medicine to help prevent HIV infection. If you choose to take medicine to prevent HIV, you should first get tested for HIV. You should then be tested every 3 months for as long as you are taking the medicine. Pregnancy  If you are about to stop having your  period (premenopausal) and you may become pregnant, seek counseling before you get pregnant.  Take 400 to 800 micrograms (mcg) of folic acid every day if you become pregnant.  Ask for birth control (contraception) if you want to prevent pregnancy. Osteoporosis and menopause Osteoporosis is a disease in which the bones lose minerals and strength with aging. This can result in bone fractures. If you are 5 years old or older, or if you are at risk for osteoporosis and fractures, ask your health care provider if you should:  Be screened for bone loss.  Take a calcium or vitamin D supplement to lower your risk of fractures.  Be given hormone replacement therapy (HRT) to treat symptoms of menopause. Follow these instructions at home: Lifestyle  Do not use any products that contain nicotine or tobacco, such as cigarettes, e-cigarettes, and chewing tobacco. If you need help quitting, ask your health care provider.  Do not use street drugs.  Do not share needles.  Ask your health care provider for help if you need support or information about quitting drugs. Alcohol use  Do not drink alcohol if: ? Your health care provider tells you not to drink. ? You are pregnant, may be pregnant, or are planning to become pregnant.  If you drink alcohol: ? Limit how much you use to 0-1 drink a day. ? Limit intake if you are breastfeeding.  Be aware of how much alcohol is in your drink. In the [place] one drink equals one 12 oz bottle of beer (355 mL), one 5 oz glass of wine (148 mL), or one 1 oz glass of hard liquor (44 mL). General instructions  Schedule regular health, dental, and eye exams.  Stay current with your vaccines.  Tell your health care provider if: ? You often feel depressed. ? You have ever been abused or do not feel safe at home. Summary  Adopting a healthy lifestyle and getting preventive care are important in promoting health and wellness.  Follow your health care provider's  instructions about healthy diet, exercising, and getting tested or screened for diseases.  Follow your health care provider's instructions on monitoring your cholesterol and blood pressure. This information is not intended to replace advice given to you by your health care provider. Make sure you discuss any questions you have with your health care provider. Document Released: 10/14/2010 Document Revised: 03/24/2018 Document Reviewed: 03/24/2018 Elsevier Patient Education  2020 [employer].

## 2018-10-20 LAB — URINALYSIS, COMPLETE W/RFL CULTURE
Bacteria, UA: NONE SEEN /HPF
Bilirubin Urine: NEGATIVE
Glucose, UA: NEGATIVE
Hgb urine dipstick: NEGATIVE
Hyaline Cast: NONE SEEN /LPF
Ketones, ur: NEGATIVE
Leukocyte Esterase: NEGATIVE
Nitrites, Initial: NEGATIVE
Protein, ur: NEGATIVE
RBC / HPF: NONE SEEN /HPF (ref 0–2)
Specific Gravity, Urine: 1.022 (ref 1.001–1.03)
Squamous Epithelial / LPF: NONE SEEN /HPF (ref <=5)
WBC, UA: NONE SEEN /HPF (ref 0–5)
pH: 7.5 (ref 5.0–8.0)

## 2018-10-20 LAB — PAP, TP IMAGING W/ HPV RNA, RFLX HPV TYPE 16,18/45: HPV DNA High Risk: NOT DETECTED

## 2018-10-20 LAB — CBC WITH DIFFERENTIAL/PLATELET
Absolute Monocytes: 472 cells/uL (ref 200–950)
Basophils Absolute: 29 cells/uL (ref 0–200)
Basophils Relative: 0.7 %
Eosinophils Absolute: 168 cells/uL (ref 15–500)
Eosinophils Relative: 4.1 %
HCT: 39.3 % (ref 35.0–45.0)
Hemoglobin: 13.2 g/dL (ref 11.7–15.5)
Lymphs Abs: 1861 cells/uL (ref 850–3900)
MCH: 32.4 pg (ref 27.0–33.0)
MCHC: 33.6 g/dL (ref 32.0–36.0)
MCV: 96.6 fL (ref 80.0–100.0)
MPV: 10.3 fL (ref 7.5–12.5)
Monocytes Relative: 11.5 %
Neutro Abs: 1570 cells/uL (ref 1500–7800)
Neutrophils Relative %: 38.3 %
Platelets: 310 10*3/uL (ref 140–400)
RBC: 4.07 10*6/uL (ref 3.80–5.10)
RDW: 11.8 % (ref 11.0–15.0)
Total Lymphocyte: 45.4 %
WBC: 4.1 10*3/uL (ref 3.8–10.8)

## 2018-10-20 LAB — LIPID PANEL
Cholesterol: 204 mg/dL — ABNORMAL HIGH (ref <200)
HDL: 52 mg/dL (ref >=50)
LDL Cholesterol (Calc): 136 mg/dL (calc) — ABNORMAL HIGH
Non-HDL Cholesterol (Calc): 152 mg/dL (calc) — ABNORMAL HIGH (ref <130)
Total CHOL/HDL Ratio: 3.9 (calc) (ref <5.0)
Triglycerides: 67 mg/dL (ref <150)

## 2018-10-20 LAB — NO CULTURE INDICATED

## 2018-10-20 LAB — GLUCOSE, RANDOM: Glucose, Bld: 90 mg/dL (ref 65–99)

## 2019-01-28 ENCOUNTER — Telehealth: Payer: Self-pay | Admitting: Family Medicine

## 2019-01-28 NOTE — Telephone Encounter (Signed)
Error

## 2019-10-24 ENCOUNTER — Encounter: Payer: Self-pay | Admitting: Nurse Practitioner

## 2019-10-24 ENCOUNTER — Other Ambulatory Visit: Payer: Self-pay

## 2019-10-24 ENCOUNTER — Ambulatory Visit (INDEPENDENT_AMBULATORY_CARE_PROVIDER_SITE_OTHER): Payer: 59 | Admitting: Nurse Practitioner

## 2019-10-24 VITALS — BP 122/80 | Ht 60.0 in | Wt 143.4 lb

## 2019-10-24 DIAGNOSIS — Z01419 Encounter for gynecological examination (general) (routine) without abnormal findings: Secondary | ICD-10-CM | POA: Diagnosis not present

## 2019-10-24 DIAGNOSIS — Z13220 Encounter for screening for lipoid disorders: Secondary | ICD-10-CM | POA: Diagnosis not present

## 2019-10-24 DIAGNOSIS — Z3042 Encounter for surveillance of injectable contraceptive: Secondary | ICD-10-CM

## 2019-10-24 LAB — COMPREHENSIVE METABOLIC PANEL
AG Ratio: 1.7 (calc) (ref 1.0–2.5)
ALT: 15 U/L (ref 6–29)
AST: 14 U/L (ref 10–30)
Albumin: 4.3 g/dL (ref 3.6–5.1)
Alkaline phosphatase (APISO): 33 U/L (ref 31–125)
BUN: 9 mg/dL (ref 7–25)
CO2: 25 mmol/L (ref 20–32)
Calcium: 9 mg/dL (ref 8.6–10.2)
Chloride: 105 mmol/L (ref 98–110)
Creat: 0.71 mg/dL (ref 0.50–1.10)
Globulin: 2.6 g/dL (calc) (ref 1.9–3.7)
Glucose, Bld: 92 mg/dL (ref 65–99)
Potassium: 3.6 mmol/L (ref 3.5–5.3)
Sodium: 138 mmol/L (ref 135–146)
Total Bilirubin: 0.8 mg/dL (ref 0.2–1.2)
Total Protein: 6.9 g/dL (ref 6.1–8.1)

## 2019-10-24 LAB — CBC WITH DIFFERENTIAL/PLATELET
Absolute Monocytes: 459 cells/uL (ref 200–950)
Basophils Absolute: 32 cells/uL (ref 0–200)
Basophils Relative: 0.7 %
Eosinophils Absolute: 171 cells/uL (ref 15–500)
Eosinophils Relative: 3.8 %
HCT: 39.9 % (ref 35.0–45.0)
Hemoglobin: 13.2 g/dL (ref 11.7–15.5)
Lymphs Abs: 2210 cells/uL (ref 850–3900)
MCH: 32.6 pg (ref 27.0–33.0)
MCHC: 33.1 g/dL (ref 32.0–36.0)
MCV: 98.5 fL (ref 80.0–100.0)
MPV: 9.9 fL (ref 7.5–12.5)
Monocytes Relative: 10.2 %
Neutro Abs: 1629 cells/uL (ref 1500–7800)
Neutrophils Relative %: 36.2 %
Platelets: 308 10*3/uL (ref 140–400)
RBC: 4.05 10*6/uL (ref 3.80–5.10)
RDW: 11.8 % (ref 11.0–15.0)
Total Lymphocyte: 49.1 %
WBC: 4.5 10*3/uL (ref 3.8–10.8)

## 2019-10-24 LAB — LIPID PANEL
Cholesterol: 210 mg/dL — ABNORMAL HIGH (ref <200)
HDL: 53 mg/dL (ref >=50)
LDL Cholesterol (Calc): 137 mg/dL (calc) — ABNORMAL HIGH
Non-HDL Cholesterol (Calc): 157 mg/dL (calc) — ABNORMAL HIGH (ref <130)
Total CHOL/HDL Ratio: 4 (calc) (ref <5.0)
Triglycerides: 96 mg/dL (ref <150)

## 2019-10-24 NOTE — Patient Instructions (Signed)
Health Maintenance, Female Adopting a healthy lifestyle and getting preventive care are important in promoting health and wellness. Ask your health care provider about:  The right schedule for you to have regular tests and exams.  Things you can do on your own to prevent diseases and keep yourself healthy. What should I know about diet, weight, and exercise? Eat a healthy diet   Eat a diet that includes plenty of vegetables, fruits, low-fat dairy products, and lean protein.  Do not eat a lot of foods that are high in solid fats, added sugars, or sodium. Maintain a healthy weight Body mass index (BMI) is used to identify weight problems. It estimates body fat based on height and weight. Your health care provider can help determine your BMI and help you achieve or maintain a healthy weight. Get regular exercise Get regular exercise. This is one of the most important things you can do for your health. Most adults should:  Exercise for at least 150 minutes each week. The exercise should increase your heart rate and make you sweat (moderate-intensity exercise).  Do strengthening exercises at least twice a week. This is in addition to the moderate-intensity exercise.  Spend less time sitting. Even light physical activity can be beneficial. Watch cholesterol and blood lipids Have your blood tested for lipids and cholesterol at 36 years of age, then have this test every 5 years. Have your cholesterol levels checked more often if:  Your lipid or cholesterol levels are high.  You are older than 36 years of age.  You are at high risk for heart disease. What should I know about cancer screening? Depending on your health history and family history, you may need to have cancer screening at various ages. This may include screening for:  Breast cancer.  Cervical cancer.  Colorectal cancer.  Skin cancer.  Lung cancer. What should I know about heart disease, diabetes, and high blood  pressure? Blood pressure and heart disease  High blood pressure causes heart disease and increases the risk of stroke. This is more likely to develop in people who have high blood pressure readings, are of African descent, or are overweight.  Have your blood pressure checked: ? Every 3-5 years if you are 18-39 years of age. ? Every year if you are 40 years old or older. Diabetes Have regular diabetes screenings. This checks your fasting blood sugar level. Have the screening done:  Once every three years after age 40 if you are at a normal weight and have a low risk for diabetes.  More often and at a younger age if you are overweight or have a high risk for diabetes. What should I know about preventing infection? Hepatitis B If you have a higher risk for hepatitis B, you should be screened for this virus. Talk with your health care provider to find out if you are at risk for hepatitis B infection. Hepatitis C Testing is recommended for:  Everyone born from 1945 through 1965.  Anyone with known risk factors for hepatitis C. Sexually transmitted infections (STIs)  Get screened for STIs, including gonorrhea and chlamydia, if: ? You are sexually active and are younger than 36 years of age. ? You are older than 36 years of age and your health care provider tells you that you are at risk for this type of infection. ? Your sexual activity has changed since you were last screened, and you are at increased risk for chlamydia or gonorrhea. Ask your health care provider if   you are at risk.  Ask your health care provider about whether you are at high risk for HIV. Your health care provider may recommend a prescription medicine to help prevent HIV infection. If you choose to take medicine to prevent HIV, you should first get tested for HIV. You should then be tested every 3 months for as long as you are taking the medicine. Pregnancy  If you are about to stop having your period (premenopausal) and  you may become pregnant, seek counseling before you get pregnant.  Take 400 to 800 micrograms (mcg) of folic acid every day if you become pregnant.  Ask for birth control (contraception) if you want to prevent pregnancy. Osteoporosis and menopause Osteoporosis is a disease in which the bones lose minerals and strength with aging. This can result in bone fractures. If you are 65 years old or older, or if you are at risk for osteoporosis and fractures, ask your health care provider if you should:  Be screened for bone loss.  Take a calcium or vitamin D supplement to lower your risk of fractures.  Be given hormone replacement therapy (HRT) to treat symptoms of menopause. Follow these instructions at home: Lifestyle  Do not use any products that contain nicotine or tobacco, such as cigarettes, e-cigarettes, and chewing tobacco. If you need help quitting, ask your health care provider.  Do not use street drugs.  Do not share needles.  Ask your health care provider for help if you need support or information about quitting drugs. Alcohol use  Do not drink alcohol if: ? Your health care provider tells you not to drink. ? You are pregnant, may be pregnant, or are planning to become pregnant.  If you drink alcohol: ? Limit how much you use to 0-1 drink a day. ? Limit intake if you are breastfeeding.  Be aware of how much alcohol is in your drink. In the [place] one drink equals one 12 oz bottle of beer (355 mL), one 5 oz glass of wine (148 mL), or one 1 oz glass of hard liquor (44 mL). General instructions  Schedule regular health, dental, and eye exams.  Stay current with your vaccines.  Tell your health care provider if: ? You often feel depressed. ? You have ever been abused or do not feel safe at home. Summary  Adopting a healthy lifestyle and getting preventive care are important in promoting health and wellness.  Follow your health care provider's instructions about healthy  diet, exercising, and getting tested or screened for diseases.  Follow your health care provider's instructions on monitoring your cholesterol and blood pressure. This information is not intended to replace advice given to you by your health care provider. Make sure you discuss any questions you have with your health care provider. Document Revised: 03/24/2018 Document Reviewed: 03/24/2018 Elsevier Patient Education  2020 [employer] Inc[employer]  

## 2019-10-24 NOTE — Progress Notes (Signed)
   Brandy Tyler November 04, 1985 [id number]   History:  34 y.o. MBF G3P3 presents for annual exam without GYN complaints. Amenorrheic on Depo Provera. She self-administers.   Gynecologic History No LMP recorded. Patient has had an injection.   Contraception: Depo-Provera injections Last Pap: 10/19/2018. Results were: normal   Past medical history, past surgical history, family history and social history were all reviewed and documented in the EPIC chart.  ROS:  A ROS was performed and pertinent positives and negatives are included.  Exam:  Vitals:   10/24/19 0913  BP: 122/80  Weight: 143 lb 6.4 oz (65 kg)  Height: 5' (1.524 m)   Body mass index is 28.01 kg/m.  General appearance:  Normal Thyroid:  Symmetrical, normal in size, without palpable masses or nodularity. Respiratory  Auscultation:  Clear without wheezing or rhonchi Cardiovascular  Auscultation:  Regular rate, without rubs, murmurs or gallops  Edema/varicosities:  Not grossly evident Abdominal  Soft,nontender, without masses, guarding or rebound.  Liver/spleen:  No organomegaly noted  Hernia:  None appreciated  Skin  Inspection:  Grossly normal   Breasts: Examined lying and sitting.   Right: Without masses, retractions, discharge or axillary adenopathy.   Left: Without masses, retractions, discharge or axillary adenopathy. Gentitourinary   Inguinal/mons:  Normal without inguinal adenopathy  External genitalia:  Normal  BUS/Urethra/Skene's glands:  Normal  Vagina:  Normal  Cervix:  Normal  Uterus:  Anteverted, normal in size, shape and contour.  Midline and mobile  Adnexa/parametria:     Rt: Without masses or tenderness.   Lt: Without masses or tenderness.  Anus and perineum: Normal   Assessment/Plan:  34 y.o. MBF G3 P3 for annual exam.  Well female exam with routine gynecological exam - Plan: CBC with Differential/Platelet, Comprehensive metabolic panel. Education provided on SBEs, importance of  preventative screenings, current guidelines, high calcium diet, regular exercise, and multivitamin daily.   Encounter for surveillance of injectable contraceptive - self administers, doing well on this. Amenorrheic.   Lipid screening - Plan: Lipid panel  Follow up in 1 year for annual         [name] [facility] 10:07 AM 10/24/2019

## 2020-01-03 ENCOUNTER — Other Ambulatory Visit: Payer: Self-pay | Admitting: *Deleted

## 2020-01-03 DIAGNOSIS — Z3042 Encounter for surveillance of injectable contraceptive: Secondary | ICD-10-CM

## 2020-01-03 MED ORDER — MEDROXYPROGESTERONE ACETATE 150 MG/ML IM SUSY: 150.0000 mg | PREFILLED_SYRINGE | INTRAMUSCULAR | 2 refills | Status: DC

## 2020-01-04 ENCOUNTER — Telehealth: Payer: Self-pay

## 2020-01-04 DIAGNOSIS — Z3042 Encounter for surveillance of injectable contraceptive: Secondary | ICD-10-CM

## 2020-01-04 MED ORDER — MEDROXYPROGESTERONE ACETATE 150 MG/ML IM SUSY: PREFILLED_SYRINGE | INTRAMUSCULAR | 2 refills | Status: DC

## 2020-01-04 NOTE — Telephone Encounter (Signed)
Pharmacy called to clarify directions for D-P injection. It was refilled yesterday but previous Rx had directions "use IM every 21 days. Every 90 days".  I resent the Rx with every 12 weeks directions on it.

## 2020-10-24 ENCOUNTER — Ambulatory Visit: Payer: 59 | Admitting: Nurse Practitioner

## 2020-10-24 DIAGNOSIS — Z0289 Encounter for other administrative examinations: Secondary | ICD-10-CM

## 2020-12-20 ENCOUNTER — Ambulatory Visit (INDEPENDENT_AMBULATORY_CARE_PROVIDER_SITE_OTHER): Payer: 59 | Admitting: Nurse Practitioner

## 2020-12-20 ENCOUNTER — Encounter: Payer: Self-pay | Admitting: Nurse Practitioner

## 2020-12-20 ENCOUNTER — Other Ambulatory Visit: Payer: Self-pay

## 2020-12-20 VITALS — BP 120/74 | Ht 59.0 in | Wt 145.0 lb

## 2020-12-20 DIAGNOSIS — Z01419 Encounter for gynecological examination (general) (routine) without abnormal findings: Secondary | ICD-10-CM | POA: Diagnosis not present

## 2020-12-20 DIAGNOSIS — Z3042 Encounter for surveillance of injectable contraceptive: Secondary | ICD-10-CM

## 2020-12-20 MED ORDER — MEDROXYPROGESTERONE ACETATE 150 MG/ML IM SUSY: PREFILLED_SYRINGE | INTRAMUSCULAR | 3 refills | Status: DC

## 2020-12-20 NOTE — Progress Notes (Signed)
   Brandy Tyler 1985-06-18 [id number]   History:  35 y.o. MBF G3P3 presents for annual exam without GYN complaints. Amenorrheic on Depo Provera - she self-administers. She has been on Depo for 14 years. Normal pap history.   Gynecologic History No LMP recorded. Patient has had an injection.   Contraception: Depo-Provera injections Sexually active: Yes  Health Maintenance Last Pap: 10/19/2018. Results were: normal, 5-year repeat Last mammogram: Not indicated Last colonoscopy: Not indicated Last Dexa: Not indicated  Past medical history, past surgical history, family history and social history were all reviewed and documented in the EPIC chart. Married. 50 yo son, 77 and 83 yo daughters.   ROS:  A ROS was performed and pertinent positives and negatives are included.  Exam:  Vitals:   12/20/20 1452  BP: 120/74  Weight: 145 lb (65.8 kg)  Height: 4\' 11"  (1.499 m)    Body mass index is 29.29 kg/m.  General appearance:  Normal Thyroid:  Symmetrical, normal in size, without palpable masses or nodularity. Respiratory  Auscultation:  Clear without wheezing or rhonchi Cardiovascular  Auscultation:  Regular rate, without rubs, murmurs or gallops  Edema/varicosities:  Not grossly evident Abdominal  Soft,nontender, without masses, guarding or rebound.  Liver/spleen:  No organomegaly noted  Hernia:  None appreciated  Skin  Inspection:  Grossly normal   Breasts: Examined lying and sitting.   Right: Without masses, retractions, discharge or axillary adenopathy.   Left: Without masses, retractions, discharge or axillary adenopathy. Gentitourinary   Inguinal/mons:  Normal without inguinal adenopathy  External genitalia:  Normal  BUS/Urethra/Skene's glands:  Normal  Vagina:  Normal  Cervix:  Normal  Uterus:  Normal in size, shape and contour.  Midline and mobile  Adnexa/parametria:     Rt: Without masses or tenderness.   Lt: Without masses or tenderness.  Anus and  perineum: Normal  Patient informed chaperone available to be present for breast and pelvic exam. Patient has requested no chaperone to be present. Patient has been advised what will be completed during breast and pelvic exam.   Assessment/Plan:  35 y.o. MBF G3 P3 for annual exam.  Well female exam with routine gynecological exam - Plan: CBC with Differential/Platelet, Comprehensive metabolic panel. Education provided on SBEs, importance of preventative screenings, current guidelines, high calcium diet, regular exercise, and multivitamin daily.   Encounter for surveillance of injectable contraceptive - Plan: medroxyPROGESTERone Acetate 150 MG/ML SUSY. Amenorrheic. Self-administers. Discussed risk of bone loss with long-term use. She wants to continue and is not interested in other forms of contraception at this time. Refill x 1 year provided.   Screening for cervical cancer - Normal Pap history.  Will repeat at 5-year interval per guidelines.  Screening for breast cancer - Will start mammograms at age 25. Normal breast exam today.  Screening for colon cancer - Average risk. Will plan colonoscopy at age 89.   Follow up in 1 year for annual         [facility], 3:27 PM 12/20/2020

## 2020-12-21 LAB — COMPREHENSIVE METABOLIC PANEL
AG Ratio: 1.8 (calc) (ref 1.0–2.5)
ALT: 17 U/L (ref 6–29)
AST: 14 U/L (ref 10–30)
Albumin: 4.4 g/dL (ref 3.6–5.1)
Alkaline phosphatase (APISO): 33 U/L (ref 31–125)
BUN: 11 mg/dL (ref 7–25)
CO2: 26 mmol/L (ref 20–32)
Calcium: 8.8 mg/dL (ref 8.6–10.2)
Chloride: 106 mmol/L (ref 98–110)
Creat: 0.71 mg/dL (ref 0.50–0.97)
Globulin: 2.4 g/dL (calc) (ref 1.9–3.7)
Glucose, Bld: 102 mg/dL — ABNORMAL HIGH (ref 65–99)
Potassium: 3.6 mmol/L (ref 3.5–5.3)
Sodium: 140 mmol/L (ref 135–146)
Total Bilirubin: 0.5 mg/dL (ref 0.2–1.2)
Total Protein: 6.8 g/dL (ref 6.1–8.1)

## 2020-12-21 LAB — CBC WITH DIFFERENTIAL/PLATELET
Absolute Monocytes: 617 cells/uL (ref 200–950)
Basophils Absolute: 18 cells/uL (ref 0–200)
Basophils Relative: 0.4 %
Eosinophils Absolute: 162 cells/uL (ref 15–500)
Eosinophils Relative: 3.6 %
HCT: 41.5 % (ref 35.0–45.0)
Hemoglobin: 13.7 g/dL (ref 11.7–15.5)
Lymphs Abs: 1332 cells/uL (ref 850–3900)
MCH: 32 pg (ref 27.0–33.0)
MCHC: 33 g/dL (ref 32.0–36.0)
MCV: 97 fL (ref 80.0–100.0)
MPV: 10.1 fL (ref 7.5–12.5)
Monocytes Relative: 13.7 %
Neutro Abs: 2372 cells/uL (ref 1500–7800)
Neutrophils Relative %: 52.7 %
Platelets: 287 10*3/uL (ref 140–400)
RBC: 4.28 10*6/uL (ref 3.80–5.10)
RDW: 11.9 % (ref 11.0–15.0)
Total Lymphocyte: 29.6 %
WBC: 4.5 10*3/uL (ref 3.8–10.8)

## 2021-12-23 NOTE — Progress Notes (Unsigned)
   Brandy Tyler November 05, 1985 [id number]   History:  36 y.o. MBF G3P3 presents for annual exam without GYN complaints. Amenorrheic on Depo Provera, self-administers. She has been on Depo for 15 years. Normal pap history.   Gynecologic History No LMP recorded. Patient has had an injection.   Contraception: Depo-Provera injections Sexually active: Yes  Health Maintenance Last Pap: 10/19/2018. Results were: Normal, 38-year repeat Last mammogram: Not indicated Last colonoscopy: Not indicated Last Dexa: Not indicated  Past medical history, past surgical history, family history and social history were all reviewed and documented in the EPIC chart. Married. 38 yo son, 37 and 33 yo daughters.   ROS:  A ROS was performed and pertinent positives and negatives are included.  Exam:  There were no vitals filed for this visit.   There is no height or weight on file to calculate BMI.  General appearance:  Normal Thyroid:  Symmetrical, normal in size, without palpable masses or nodularity. Respiratory  Auscultation:  Clear without wheezing or rhonchi Cardiovascular  Auscultation:  Regular rate, without rubs, murmurs or gallops  Edema/varicosities:  Not grossly evident Abdominal  Soft,nontender, without masses, guarding or rebound.  Liver/spleen:  No organomegaly noted  Hernia:  None appreciated  Skin  Inspection:  Grossly normal   Breasts: Examined lying and sitting.   Right: Without masses, retractions, discharge or axillary adenopathy.   Left: Without masses, retractions, discharge or axillary adenopathy. Genitourinary   Inguinal/mons:  Normal without inguinal adenopathy  External genitalia:  Normal appearing vulva with no masses, tenderness, or lesions  BUS/Urethra/Skene's glands:  Normal  Vagina:  Normal appearing with normal color and discharge, no lesions  Cervix:  Normal appearing without discharge or lesions  Uterus:  Normal in size, shape and contour.  Midline and mobile,  nontender  Adnexa/parametria:     Rt: Normal in size, without masses or tenderness.   Lt: Normal in size, without masses or tenderness.  Anus and perineum: Normal  Digital rectal exam: Not indicated  Patient informed chaperone available to be present for breast and pelvic exam. Patient has requested no chaperone to be present. Patient has been advised what will be completed during breast and pelvic exam.   Assessment/Plan:  36 y.o. MBF G3 P3 for annual exam. for annual exam.  Well female exam with routine gynecological exam - Plan: CBC with Differential/Platelet, Comprehensive metabolic panel. Education provided on SBEs, importance of preventative screenings, current guidelines, high calcium diet, regular exercise, and multivitamin daily.   Encounter for surveillance of injectable contraceptive - Plan: medroxyPROGESTERone Acetate 150 MG/ML SUSY. Amenorrheic. Self-administers. Discussed risk of bone loss with long-term use. She wants to continue and is not interested in other forms of contraception at this time. Refill x 1 year provided.   Screening for cervical cancer - Normal Pap history.  Will repeat at 5-year interval per guidelines.  Screening for breast cancer - Will start mammograms at age 36. Normal breast exam today.  Screening for colon cancer - Average risk. Will plan colonoscopy at age 36.   Follow up in 1 year for annual.         [name] [facility], 4:13 PM 12/23/2021

## 2021-12-24 ENCOUNTER — Encounter: Payer: Self-pay | Admitting: Nurse Practitioner

## 2021-12-24 ENCOUNTER — Ambulatory Visit (INDEPENDENT_AMBULATORY_CARE_PROVIDER_SITE_OTHER): Payer: 59 | Admitting: Nurse Practitioner

## 2021-12-24 VITALS — BP 104/78 | HR 106 | Ht 59.75 in | Wt 138.0 lb

## 2021-12-24 DIAGNOSIS — Z01419 Encounter for gynecological examination (general) (routine) without abnormal findings: Secondary | ICD-10-CM | POA: Diagnosis not present

## 2021-12-24 DIAGNOSIS — Z3042 Encounter for surveillance of injectable contraceptive: Secondary | ICD-10-CM | POA: Diagnosis not present

## 2021-12-24 MED ORDER — MEDROXYPROGESTERONE ACETATE 150 MG/ML IM SUSY: PREFILLED_SYRINGE | INTRAMUSCULAR | 3 refills | Status: DC

## 2022-02-03 ENCOUNTER — Other Ambulatory Visit: Payer: Self-pay | Admitting: Nurse Practitioner

## 2022-02-03 DIAGNOSIS — Z3042 Encounter for surveillance of injectable contraceptive: Secondary | ICD-10-CM

## 2023-01-02 ENCOUNTER — Other Ambulatory Visit: Payer: Self-pay | Admitting: Nurse Practitioner

## 2023-01-02 DIAGNOSIS — Z3042 Encounter for surveillance of injectable contraceptive: Secondary | ICD-10-CM

## 2023-01-02 NOTE — Telephone Encounter (Signed)
Med refill request: medroxyprogesterone acetate 150mg  Last AEX: 12/24/21 Next AEX: none scheduled Last MMG (if hormonal med) n/a Refill denied, needs office visit.  Sent to provider.

## 2023-04-16 ENCOUNTER — Ambulatory Visit: Payer: 59 | Admitting: Nurse Practitioner

## 2023-04-21 ENCOUNTER — Other Ambulatory Visit: Payer: Self-pay | Admitting: Nurse Practitioner

## 2023-04-21 DIAGNOSIS — Z3042 Encounter for surveillance of injectable contraceptive: Secondary | ICD-10-CM

## 2023-04-22 NOTE — Telephone Encounter (Signed)
 Medication refill request: depo  Last AEX:  12/24/21 Next AEX: 05/18/23 Last MMG (if hormonal medication request): n/a Refill authorized: please advise

## 2023-04-23 NOTE — Telephone Encounter (Signed)
 Pt also LVM in med refill line yesterday about this medication but mentioned that the pharmacy's name is different but on the same road. Need to contact her to confirm.

## 2023-05-18 ENCOUNTER — Other Ambulatory Visit (HOSPITAL_COMMUNITY)
Admission: RE | Admit: 2023-05-18 | Discharge: 2023-05-18 | Disposition: A | Discharge status: Home / Self Care | Payer: 59 | Source: Ambulatory Visit | Attending: Nurse Practitioner | Admitting: Nurse Practitioner

## 2023-05-18 ENCOUNTER — Ambulatory Visit (INDEPENDENT_AMBULATORY_CARE_PROVIDER_SITE_OTHER): Payer: 59 | Admitting: Nurse Practitioner

## 2023-05-18 ENCOUNTER — Other Ambulatory Visit: Payer: Self-pay | Admitting: Nurse Practitioner

## 2023-05-18 ENCOUNTER — Encounter: Payer: Self-pay | Admitting: Nurse Practitioner

## 2023-05-18 VITALS — BP 124/78 | HR 61 | Ht 59.0 in | Wt 129.0 lb

## 2023-05-18 DIAGNOSIS — Z01419 Encounter for gynecological examination (general) (routine) without abnormal findings: Secondary | ICD-10-CM

## 2023-05-18 DIAGNOSIS — Z124 Encounter for screening for malignant neoplasm of cervix: Secondary | ICD-10-CM | POA: Diagnosis present

## 2023-05-18 DIAGNOSIS — Z3042 Encounter for surveillance of injectable contraceptive: Secondary | ICD-10-CM

## 2023-05-18 DIAGNOSIS — Z13820 Encounter for screening for osteoporosis: Secondary | ICD-10-CM

## 2023-05-18 DIAGNOSIS — Z1231 Encounter for screening mammogram for malignant neoplasm of breast: Secondary | ICD-10-CM

## 2023-05-18 MED ORDER — MEDROXYPROGESTERONE ACETATE 150 MG/ML IM SUSY: PREFILLED_SYRINGE | INTRAMUSCULAR | 3 refills | Status: AC

## 2023-05-18 NOTE — Progress Notes (Signed)
Brandy Tyler 08-27-1985 [id number]   History:  38 y.o. MBF G3P3 presents for annual exam without GYN complaints. Amenorrheic on Depo Provera, self-administers. She has been on Depo for 15 years, aware of risk of long-term use and bone loss, wants to continue. Normal pap history.   Gynecologic History No LMP recorded. Patient has had an injection.   Contraception: Depo-Provera injections Sexually active: Yes  Health Maintenance Last Pap: 10/19/2018. Results were: Normal neg HPV Last mammogram: Never Last colonoscopy: Not indicated Last Dexa: Never Exercising: Yes. cardio  Smoker: no   Past medical history, past surgical history, family history and social history were all reviewed and documented in the EPIC chart. Married. 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 30 yo son, works for [employer], planning on going to school. 27 and 31 yo daughters.   ROS:  A ROS was performed and pertinent positives and negatives are included.  Exam:  Vitals:   05/18/23 1155  BP: 124/78  Pulse: 61  SpO2: 98%  Weight: 129 lb (58.5 kg)  Height: 4\' 11"  (1.499 m)      Body mass index is 26.05 kg/m.  General appearance:  Normal Thyroid:  Symmetrical, normal in size, without palpable masses or nodularity. Respiratory  Auscultation:  Clear without wheezing or rhonchi Cardiovascular  Auscultation:  Regular rate, without rubs, murmurs or gallops  Edema/varicosities:  Not grossly evident Abdominal  Soft,nontender, without masses, guarding or rebound.  Liver/spleen:  No organomegaly noted  Hernia:  None appreciated  Skin  Inspection:  Grossly normal   Breasts: Examined lying and sitting.   Right: Without masses, retractions, discharge or axillary adenopathy.   Left: Without masses, retractions, discharge or axillary adenopathy. Pelvic: External genitalia:  no lesions              Urethra:  normal appearing urethra with no masses, tenderness or lesions              Bartholins and Skenes: normal                  Vagina: normal appearing vagina with normal color and discharge, no lesions              Cervix: no lesions Bimanual Exam:  Uterus:  no masses or tenderness              Adnexa: no mass, fullness, tenderness              Rectovaginal: Deferred              Anus:  normal, no lesions  Patient informed chaperone available to be present for breast and pelvic exam. Patient has requested no chaperone to be present. Patient has been advised what will be completed during breast and pelvic exam.   Assessment/Plan:  38 y.o. MBF G3 P3 for annual exam.  Well female exam with routine gynecological exam - Education provided on SBEs, importance of preventative screenings, current guidelines, high calcium diet, regular exercise, and multivitamin daily. Labs with PCP.   Encounter for surveillance of injectable contraceptive - Plan: medroxyPROGESTERone Acetate 150 MG/ML SUSY. Amenorrheic. Self-administers. Discussed risk of bone loss with long-term use. Has been taking for 16 years. She wants to continue and is not interested in other forms of contraception at this time. Will schedule DXA at [facility].   Cervical cancer screening - Plan: Cytology - PAP( [place]). Normal pap history.   Screening for osteoporosis - Plan: DG Bone Density. Long-term depo use.   Screening for  breast cancer - Discussed current guidelines and importance of preventative screenings. Information provided on local breast imaging centers. Normal breast exam today.  Screening for colon cancer - Average risk. Will plan colonoscopy at age 24.   Return in about 1 year (around 05/17/2024) for Annual.         [name] [facility], 12:25 PM 05/18/2023

## 2023-05-20 LAB — CYTOLOGY - PAP
Comment: NEGATIVE
Diagnosis: NEGATIVE
High risk HPV: NEGATIVE

## 2023-06-01 ENCOUNTER — Ambulatory Visit
Admission: RE | Admit: 2023-06-01 | Discharge: 2023-06-01 | Disposition: A | Discharge status: Home / Self Care | Payer: 59 | Source: Ambulatory Visit | Attending: Nurse Practitioner

## 2023-06-01 DIAGNOSIS — Z1231 Encounter for screening mammogram for malignant neoplasm of breast: Secondary | ICD-10-CM

## 2024-01-05 ENCOUNTER — Other Ambulatory Visit: Payer: Self-pay

## 2024-01-05 ENCOUNTER — Encounter: Payer: Self-pay | Admitting: Nurse Practitioner

## 2024-01-05 ENCOUNTER — Other Ambulatory Visit: Payer: 59
